# Patient Record
Sex: Female | Born: 1970 | Race: White | Hispanic: No | Marital: Married | Smoking: Former smoker
Health system: Southern US, Community
[De-identification: ages and names within clinical notes are randomized; demographics above are authoritative.]

## PROBLEM LIST (undated history)

## (undated) DIAGNOSIS — R002 Palpitations: Secondary | ICD-10-CM

## (undated) DIAGNOSIS — K219 Gastro-esophageal reflux disease without esophagitis: Secondary | ICD-10-CM

## (undated) DIAGNOSIS — Z9889 Other specified postprocedural states: Secondary | ICD-10-CM

## (undated) DIAGNOSIS — I499 Cardiac arrhythmia, unspecified: Secondary | ICD-10-CM

## (undated) DIAGNOSIS — G4730 Sleep apnea, unspecified: Secondary | ICD-10-CM

## (undated) DIAGNOSIS — R112 Nausea with vomiting, unspecified: Secondary | ICD-10-CM

## (undated) DIAGNOSIS — I10 Essential (primary) hypertension: Secondary | ICD-10-CM

## (undated) DIAGNOSIS — F329 Major depressive disorder, single episode, unspecified: Secondary | ICD-10-CM

## (undated) DIAGNOSIS — F419 Anxiety disorder, unspecified: Secondary | ICD-10-CM

## (undated) DIAGNOSIS — I493 Ventricular premature depolarization: Secondary | ICD-10-CM

## (undated) DIAGNOSIS — R42 Dizziness and giddiness: Secondary | ICD-10-CM

## (undated) DIAGNOSIS — F32A Depression, unspecified: Secondary | ICD-10-CM

## (undated) HISTORY — DX: Morbid (severe) obesity due to excess calories: E66.01

## (undated) HISTORY — DX: Ventricular premature depolarization: I49.3

## (undated) HISTORY — DX: Dizziness and giddiness: R42

## (undated) HISTORY — DX: Anxiety disorder, unspecified: F41.9

## (undated) HISTORY — DX: Essential (primary) hypertension: I10

## (undated) HISTORY — DX: Palpitations: R00.2

## (undated) HISTORY — PX: OTHER SURGICAL HISTORY: SHX169

## (undated) HISTORY — DX: Gastro-esophageal reflux disease without esophagitis: K21.9

---

## 2000-09-22 ENCOUNTER — Emergency Department (HOSPITAL_COMMUNITY): Admission: EM | Admit: 2000-09-22 | Discharge: 2000-09-22 | Payer: Self-pay | Admitting: Emergency Medicine

## 2001-06-10 ENCOUNTER — Encounter: Payer: Self-pay | Admitting: *Deleted

## 2001-06-10 ENCOUNTER — Emergency Department (HOSPITAL_COMMUNITY): Admission: EM | Admit: 2001-06-10 | Discharge: 2001-06-10 | Payer: Self-pay | Admitting: *Deleted

## 2002-01-24 ENCOUNTER — Emergency Department (HOSPITAL_COMMUNITY): Admission: EM | Admit: 2002-01-24 | Discharge: 2002-01-24 | Payer: Self-pay | Admitting: Emergency Medicine

## 2002-01-27 ENCOUNTER — Encounter: Payer: Self-pay | Admitting: Pediatrics

## 2002-01-27 ENCOUNTER — Ambulatory Visit (HOSPITAL_COMMUNITY): Admission: RE | Admit: 2002-01-27 | Discharge: 2002-01-27 | Payer: Self-pay | Admitting: Pediatrics

## 2002-09-15 ENCOUNTER — Emergency Department (HOSPITAL_COMMUNITY): Admission: EM | Admit: 2002-09-15 | Discharge: 2002-09-15 | Payer: Self-pay | Admitting: Emergency Medicine

## 2003-01-21 ENCOUNTER — Ambulatory Visit (HOSPITAL_COMMUNITY): Admission: RE | Admit: 2003-01-21 | Discharge: 2003-01-21 | Payer: Self-pay | Admitting: Family Medicine

## 2004-01-12 ENCOUNTER — Ambulatory Visit: Payer: Self-pay | Admitting: Cardiology

## 2004-01-28 ENCOUNTER — Ambulatory Visit: Payer: Self-pay | Admitting: Cardiology

## 2010-01-03 ENCOUNTER — Emergency Department (HOSPITAL_COMMUNITY)
Admission: EM | Admit: 2010-01-03 | Discharge: 2010-01-03 | Payer: Self-pay | Source: Home / Self Care | Admitting: Emergency Medicine

## 2010-02-19 ENCOUNTER — Encounter: Payer: Self-pay | Admitting: Pediatrics

## 2010-04-12 LAB — BASIC METABOLIC PANEL
BUN: 8 mg/dL (ref 6–23)
CO2: 29 mEq/L (ref 19–32)
Calcium: 8.8 mg/dL (ref 8.4–10.5)
Chloride: 102 mEq/L (ref 96–112)
Creatinine, Ser: 0.78 mg/dL (ref 0.4–1.2)
GFR calc Af Amer: >60 mL/min (ref >60)
GFR calc non Af Amer: >60 mL/min (ref >60)
Glucose, Bld: 107 mg/dL — ABNORMAL HIGH (ref 70–99)
Potassium: 4.7 mEq/L (ref 3.5–5.1)
Sodium: 137 mEq/L (ref 135–145)

## 2010-04-12 LAB — CBC
HCT: 37.8 % (ref 36.0–46.0)
Hemoglobin: 13.1 g/dL (ref 12.0–15.0)
MCH: 30.6 pg (ref 26.0–34.0)
MCHC: 34.7 g/dL (ref 30.0–36.0)
MCV: 88.3 fL (ref 78.0–100.0)
Platelets: 270 10*3/uL (ref 150–400)
RBC: 4.28 MIL/uL (ref 3.87–5.11)
RDW: 13.1 % (ref 11.5–15.5)
WBC: 10.9 10*3/uL — ABNORMAL HIGH (ref 4.0–10.5)

## 2010-04-12 LAB — DIFFERENTIAL
Basophils Absolute: 0 10*3/uL (ref 0.0–0.1)
Basophils Relative: 0 % (ref 0–1)
Eosinophils Absolute: 0.2 10*3/uL (ref 0.0–0.7)
Eosinophils Relative: 2 % (ref 0–5)
Lymphocytes Relative: 21 % (ref 12–46)
Lymphs Abs: 2.2 10*3/uL (ref 0.7–4.0)
Monocytes Absolute: 0.7 10*3/uL (ref 0.1–1.0)
Monocytes Relative: 6 % (ref 3–12)
Neutro Abs: 7.7 10*3/uL (ref 1.7–7.7)
Neutrophils Relative %: 71 % (ref 43–77)

## 2012-02-10 ENCOUNTER — Encounter: Payer: Self-pay | Admitting: Physician Assistant

## 2012-02-19 ENCOUNTER — Encounter: Payer: Self-pay | Admitting: Cardiology

## 2012-02-21 ENCOUNTER — Encounter: Payer: Self-pay | Admitting: Cardiology

## 2012-02-21 ENCOUNTER — Ambulatory Visit (INDEPENDENT_AMBULATORY_CARE_PROVIDER_SITE_OTHER): Payer: Commercial Indemnity | Admitting: Cardiology

## 2012-02-21 VITALS — BP 136/87 | HR 82 | Ht 62.0 in | Wt 271.0 lb

## 2012-02-21 DIAGNOSIS — Z79899 Other long term (current) drug therapy: Secondary | ICD-10-CM

## 2012-02-21 DIAGNOSIS — I2789 Other specified pulmonary heart diseases: Secondary | ICD-10-CM

## 2012-02-21 DIAGNOSIS — I493 Ventricular premature depolarization: Secondary | ICD-10-CM

## 2012-02-21 DIAGNOSIS — I272 Pulmonary hypertension, unspecified: Secondary | ICD-10-CM | POA: Insufficient documentation

## 2012-02-21 DIAGNOSIS — I4949 Other premature depolarization: Secondary | ICD-10-CM

## 2012-02-21 DIAGNOSIS — R002 Palpitations: Secondary | ICD-10-CM | POA: Insufficient documentation

## 2012-02-21 MED ORDER — METOPROLOL SUCCINATE ER 25 MG PO TB24: 25.0000 mg | ORAL_TABLET | Freq: Two times a day (BID) | ORAL | Status: DC

## 2012-02-21 NOTE — Progress Notes (Signed)
Patient ID: Rachel Holland, female   DOB: 19-Sep-1970, 42 y.o.   MRN: [id number] PCP: Dr. Sherryll Burger  42 yo with history of symptomatic PVCs and pulmonary HTN on prior echo presents for evaluation of palpitations.  Patient was admitted to [facility] in 11/12 with chest pain and palpitations.  Telemetry showed frequent PVCs.  CTA chest was negative for PE.  Stress echo was submaximal but showed no regional wall motion abnormalities.  Echo showed a mildly dilated RV with PA systolic pressure estimated at 50 mmHg.  She was diagnosed with OSA around this time and started on CPAP.  She was started on metoprolol 25 mg bid in the hospital and palpitations resolved.    She did well until about 3 wks ago.  Some time back she had decreased her metoprolol to 12.5 mg bid. About 3 wks ago, she began to notice increased palpitations again.  No lightheadedness.  They were quite bothersome.  She went to the ER, ECG showed PVCs.  Metoprolol was increased back to 25 mg bid.  However, she continues to have palpitations.  No definite trigger for symptoms.  No increased stress.  She has been using her CPAP.  She chronically has drunk a fair amount of caffeine but has been off caffeine now for 3 wks.  No new medications.  She denies chest pain or significant dyspnea.  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    ECG (1/14): NSR, frequent PVCs.  PMH: 1. HTN 2. H/o PVCs 3. Pulmonary HTN: ? Due to OSA.  Echo (11/12) with EF 60-65%, mild to moderate LVH, mild RV dilation with normal systolic function, PA systolic pressure 50 mmHg. 4. OSA on CPAP. 5. Stress echo (11/12) with 4'57", submaximal, no wall motion abnormalities.  6. Obesity 7. Anxiety  SH: Lives in [place], quit smoking in 2011, cleans houses, 3 children.   FH: Father with parkinsons, Guillain-Barre syndrome.   ROS: All systems reviewed and negative except as per HPI.   Current Outpatient Prescriptions  Medication Sig Dispense Refill  .  clonazePAM (KLONOPIN) 0.5 MG tablet Take 0.5 mg by mouth as needed.       . ranitidine (ZANTAC) 150 MG tablet Take 150 mg by mouth 2 (two) times daily.      . sertraline (ZOLOFT) 50 MG tablet Take 50 mg by mouth daily.        BP 136/87  Pulse 82  Ht 5\' 2"  (1.575 m)  Wt 271 lb (122.925 kg)  BMI 49.57 kg/m2 General: NAD, obese Neck: Thick, no JVD, no thyromegaly or thyroid nodule.  Lungs: Clear to auscultation bilaterally with normal respiratory effort. CV: Nondisplaced PMI.  Heart regular with premature beats, no S3/S4, no murmur.  No peripheral edema.  No carotid bruit.  Normal pedal pulses.  Abdomen: Soft, nontender, no hepatosplenomegaly, no distention.  Skin: Intact without lesions or rashes.  Neurologic: Alert and oriented x 3.  Psych: Normal affect. Extremities: No clubbing or cyanosis.  HEENT: Normal.   Assessment/Plan: 1. Palpitations: Most likely due to PVCs.  Not sure why they have all of the sudden worsened.  She does not seem to have had any significant events to explain the worsening.  She is now off caffeine and has increased her metoprolol back to 25 mg bid without much relief. - 48 hour holter to document frequency of PVCs and to make sure that there are no other arrhythmias.  - Stop short-acting metoprolol, will have her take Toprol XL  25 mg bid for better coverage.   - Check CBC for anemia and TSH.  2. Pulmonary HTN: PA systolic pressure 50 mmHg with mild RV dilation on echo from 11/12.  This may be due to OSA => she started CPAP around that time.  I will have her get a repeat echo to reassess PA pressure and RV size/function.   3. Obesity: She needs to work on weight loss with diet and exercise.  We discussed this.   Marca Ancona 02/21/2012 2:36 PM

## 2012-02-21 NOTE — Patient Instructions (Addendum)
   Echo   48 hour holter monitor  Stop Metoprolol Tartrate  Begin Toprol XL 25mg  twice a day    Labs today for TSH, BMET, CBC   Office will contact with results Follow up in  2 weeks

## 2012-02-23 ENCOUNTER — Telehealth: Payer: Self-pay | Admitting: *Deleted

## 2012-02-23 ENCOUNTER — Ambulatory Visit (INDEPENDENT_AMBULATORY_CARE_PROVIDER_SITE_OTHER): Payer: Commercial Indemnity | Admitting: *Deleted

## 2012-02-23 VITALS — BP 107/67 | HR 86

## 2012-02-23 DIAGNOSIS — R002 Palpitations: Secondary | ICD-10-CM

## 2012-02-23 DIAGNOSIS — I493 Ventricular premature depolarization: Secondary | ICD-10-CM

## 2012-02-23 DIAGNOSIS — I4949 Other premature depolarization: Secondary | ICD-10-CM

## 2012-02-23 NOTE — Telephone Encounter (Signed)
Patient in office this morning for her 48 hour holter monitor.  Patient notified and verbalized understanding.

## 2012-02-23 NOTE — Progress Notes (Signed)
Patient in office this morning for 48 hour holter monitor placement.  States she was not able to begin the Toprol XL 25mg  twice a day due to cost of her co-pay.  She is still taking the Metoprolol Tart 25mg  twice a day.  States that she does notice that she has felt a little light headed lately with recent increase on the Metoprolol.  She previously was taking 12.5mg  twice a day.  BP this morning in office was 107/67  86.  She is scheduled for her Echo on Wednesday.

## 2012-02-23 NOTE — Telephone Encounter (Signed)
Message copied by Lesle Chris on Fri Feb 23, 2012 11:03 AM ------      Message from: Laurey Morale      Created: Thu Feb 22, 2012  6:00 PM       Slight elevation in WBCs, otherwise labs all normal.

## 2012-02-28 ENCOUNTER — Other Ambulatory Visit (INDEPENDENT_AMBULATORY_CARE_PROVIDER_SITE_OTHER): Payer: Commercial Indemnity

## 2012-02-28 ENCOUNTER — Other Ambulatory Visit: Payer: Self-pay

## 2012-02-28 DIAGNOSIS — I2789 Other specified pulmonary heart diseases: Secondary | ICD-10-CM

## 2012-02-28 DIAGNOSIS — I4949 Other premature depolarization: Secondary | ICD-10-CM

## 2012-02-28 DIAGNOSIS — R002 Palpitations: Secondary | ICD-10-CM

## 2012-02-28 DIAGNOSIS — I272 Pulmonary hypertension, unspecified: Secondary | ICD-10-CM

## 2012-02-29 ENCOUNTER — Telehealth: Payer: Self-pay | Admitting: *Deleted

## 2012-02-29 NOTE — Telephone Encounter (Signed)
Message copied by Lesle Chris on Thu Feb 29, 2012 12:02 PM ------      Message from: Laurey Morale      Created: Wed Feb 28, 2012  8:14 PM       Still with mild pulmonary HTN at least.  Will need to have followup in office, consider right heart cath.

## 2012-02-29 NOTE — Telephone Encounter (Signed)
Notes Recorded by Lesle Chris, LPN on 1/61/0960 at 12:02 PM Patient notified. OV scheduled with Dr. Shirlee Latch for tomorrow at 9:15. ------  Notes Recorded by Lesle Chris, LPN on 4/54/0981 at 10:24 AM Left message to return call.

## 2012-03-01 ENCOUNTER — Ambulatory Visit (INDEPENDENT_AMBULATORY_CARE_PROVIDER_SITE_OTHER): Payer: Commercial Indemnity | Admitting: Cardiology

## 2012-03-01 ENCOUNTER — Encounter: Payer: Self-pay | Admitting: Cardiology

## 2012-03-01 VITALS — BP 127/84 | HR 84 | Ht 62.0 in | Wt 266.0 lb

## 2012-03-01 DIAGNOSIS — I2789 Other specified pulmonary heart diseases: Secondary | ICD-10-CM

## 2012-03-01 DIAGNOSIS — R0989 Other specified symptoms and signs involving the circulatory and respiratory systems: Secondary | ICD-10-CM

## 2012-03-01 DIAGNOSIS — I272 Pulmonary hypertension, unspecified: Secondary | ICD-10-CM

## 2012-03-01 DIAGNOSIS — R002 Palpitations: Secondary | ICD-10-CM

## 2012-03-01 MED ORDER — METOPROLOL TARTRATE 25 MG PO TABS: 25.0000 mg | ORAL_TABLET | Freq: Three times a day (TID) | ORAL | Status: DC

## 2012-03-01 NOTE — Patient Instructions (Addendum)
   Lab for ANA  Office will contact with results  Increase Metoprolol to 25mg  three times per day (new sent to pharm)  Echo just before next office visit Your physician wants you to follow up in: 6 months.  You will receive a reminder letter in the mail one-two months in advance.  If you don't receive a letter, please call our office to schedule the follow up appointment

## 2012-03-01 NOTE — Progress Notes (Signed)
Patient ID: Rachel Holland, female   DOB: November 18, 1970, 42 y.o.   MRN: [id number] PCP: Dr. Sherryll Burger  42 yo with history of symptomatic PVCs and pulmonary HTN on prior echo returns for cardiology evaluation of PVCs and pulmonary hypertension.  Patient was admitted to [facility] in 11/12 with chest pain and palpitations.  Telemetry showed frequent PVCs.  CTA chest was negative for PE.  Stress echo was submaximal but showed no regional wall motion abnormalities.  Echo showed a mildly dilated RV with PA systolic pressure estimated at 50 mmHg.  She was diagnosed with OSA around this time and started on CPAP.  She was started on metoprolol 25 mg bid in the hospital and palpitations resolved.  She did well until about 6-8 wks ago.  She had decreased her metoprolol to 12.5 mg bid, and she began to notice increased palpitations again.  No lightheadedness.  They were quite bothersome.  She went to the ER, ECG showed PVCs.  Metoprolol was increased back to 25 mg bid.  However, she continues to have palpitations.  No definite trigger for symptoms.  No increased stress.  She has been using her CPAP.  She chronically has drunk a fair amount of caffeine but has been off caffeine now for several weeks.  She denies chest pain or significant dyspnea.  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  She is mildly winded going up a flight of steps.   Now that she has been on CPAP, I repeated an echocardiogram to reassess pulmonary artery pressure.  Her RV was borderline enlarged but RV systolic function was normal. PA systolic pressure was estimated at 40-45 mmHg.  I had her do a holter monitor, which showed frequent PVCs, comprising about 6% of total beats. No VT/NSVT.   Labs (1/14): K 3.8, creatinine 0.75, TSH normal, HCT 41.4  PMH: 1. HTN 2. H/o PVCs: Holter (1/14) with frequent PVCs (6% of total beats), no VT/NSVT.  3. Pulmonary HTN: ? Due to OSA.  Echo (11/12) with EF 60-65%, mild to moderate LVH,  mild RV dilation with normal systolic function, PA systolic pressure 50 mmHg.  Echo (1/14): EF 60-65%, RV upper normal in size with normal systolic function, PA systolic pressure 40-45 mmHg.  4. OSA on CPAP. 5. Stress echo (11/12) with 4'57", submaximal, no wall motion abnormalities.  6. Obesity 7. Anxiety  SH: Lives in [place], quit smoking in 2011, cleans houses, 3 children.   FH: Father with parkinsons, Guillain-Barre syndrome.   ROS: All systems reviewed and negative except as per HPI.   Current Outpatient Prescriptions  Medication Sig Dispense Refill  . clonazePAM (KLONOPIN) 0.5 MG tablet Take 0.5 mg by mouth as needed.       . metoprolol tartrate (LOPRESSOR) 25 MG tablet Take 1 tablet (25 mg total) by mouth 3 (three) times daily.  90 tablet  6  . ranitidine (ZANTAC) 150 MG tablet Take 150 mg by mouth 2 (two) times daily.      . sertraline (ZOLOFT) 50 MG tablet Take 50 mg by mouth daily.        BP 127/84  Pulse 84  Ht 5\' 2"  (1.575 m)  Wt 266 lb (120.657 kg)  BMI 48.65 kg/m2 General: NAD, obese Neck: Thick, no JVD, no thyromegaly or thyroid nodule.  Lungs: Clear to auscultation bilaterally with normal respiratory effort. CV: Nondisplaced PMI.  Heart regular with premature beats, no S3/S4, no murmur.  No peripheral edema.  No carotid bruit.  Normal pedal pulses.  Abdomen: Soft, nontender, no hepatosplenomegaly, no distention.  Neurologic: Alert and oriented x 3.  Psych: Anxious Extremities: No clubbing or cyanosis.   Assessment/Plan: 1. Palpitations: Patient has frequent PVCs.  Not sure why they have all of the sudden worsened.  She does not seem to have had any significant events to explain the worsening.  She is now off caffeine.  The PVCs comprise 6% of total beats on holter, this is probably not enough to lead to a cardiomyopathy.  LV systolic function was normal on echo.  I had wanted to change her to Toprol XL for better coverage, but it is too expensive for her.  TSH was  normal.  - Increase metoprolol to 25 mg tid.  Increasing the dose of metoprolol from 25 mg makes her lightheaded, so I will increase the frequency for better coverage.  She says that metoprolol helps when she takes it.  - I encouraged her to avoid caffeine and to get regular exercise and sleep.   2. Pulmonary HTN: PA systolic pressure estimated 40-45 mmHg with borderline RV dilation on most recent echo.  This seems improved from the 11/12 echo.  She has been on CPAP over this time.  I think that her apparently mild pulmonary HTN may be due primarily to OSA.  I suggested that she work very hard on weight loss over the next 6 months, with goal being 20-30 lbs.  Will repeat an echo after this to see if PA pressure further improves.  At this time, unless PA pressure worsens or she develops more dyspnea, I would not do a right heart cath.  I will get an ANA to screen for collagen vascular disease.  3. Obesity: She is morbidly obese with BMI 48.  She needs to work on weight loss with diet and exercise.  We discussed this again.   Marca Ancona 03/01/2012 11:19 AM

## 2012-03-25 ENCOUNTER — Encounter: Payer: Self-pay | Admitting: *Deleted

## 2012-03-26 ENCOUNTER — Telehealth: Payer: Self-pay | Admitting: Cardiology

## 2012-03-26 NOTE — Telephone Encounter (Signed)
Patient called wanting test results from lab work. Informed patient that Dr. Shirlee Latch has not received the labs yet but I would print them off and send them to him and as soon as he told his nurse the results his nurse would call her back. Pt verbalized understanding.

## 2012-04-12 ENCOUNTER — Ambulatory Visit: Payer: Self-pay | Admitting: Cardiology

## 2012-05-24 ENCOUNTER — Other Ambulatory Visit: Payer: Self-pay | Admitting: *Deleted

## 2012-05-24 DIAGNOSIS — R002 Palpitations: Secondary | ICD-10-CM

## 2012-05-24 DIAGNOSIS — R0989 Other specified symptoms and signs involving the circulatory and respiratory systems: Secondary | ICD-10-CM

## 2012-07-24 ENCOUNTER — Other Ambulatory Visit: Payer: Self-pay

## 2012-07-24 ENCOUNTER — Other Ambulatory Visit (INDEPENDENT_AMBULATORY_CARE_PROVIDER_SITE_OTHER): Payer: Commercial Indemnity

## 2012-07-24 DIAGNOSIS — R002 Palpitations: Secondary | ICD-10-CM

## 2012-07-24 DIAGNOSIS — I2789 Other specified pulmonary heart diseases: Secondary | ICD-10-CM

## 2012-07-24 DIAGNOSIS — R0989 Other specified symptoms and signs involving the circulatory and respiratory systems: Secondary | ICD-10-CM

## 2012-07-26 ENCOUNTER — Telehealth: Payer: Self-pay | Admitting: *Deleted

## 2012-07-26 NOTE — Telephone Encounter (Signed)
Patient informed. 

## 2012-07-26 NOTE — Telephone Encounter (Signed)
Message copied by Eustace Moore on Fri Jul 26, 2012  4:40 PM ------      Message from: Laurey Morale      Created: Fri Jul 26, 2012  3:30 PM       Normal EF ------

## 2012-07-30 ENCOUNTER — Encounter: Payer: Self-pay | Admitting: Cardiology

## 2012-07-30 ENCOUNTER — Ambulatory Visit (INDEPENDENT_AMBULATORY_CARE_PROVIDER_SITE_OTHER): Payer: Commercial Indemnity | Admitting: Cardiology

## 2012-07-30 VITALS — BP 129/81 | HR 86 | Ht 62.0 in | Wt 266.0 lb

## 2012-07-30 DIAGNOSIS — I2789 Other specified pulmonary heart diseases: Secondary | ICD-10-CM

## 2012-07-30 DIAGNOSIS — I272 Pulmonary hypertension, unspecified: Secondary | ICD-10-CM

## 2012-07-30 DIAGNOSIS — R002 Palpitations: Secondary | ICD-10-CM

## 2012-07-30 NOTE — Patient Instructions (Addendum)
Continue all current medications. Bariatric phone number given - [phone] Your physician wants you to follow up in: 6 months.  You will receive a reminder letter in the mail one-two months in advance.  If you don't receive a letter, please call our office to schedule the follow up appointment

## 2012-07-30 NOTE — Progress Notes (Signed)
Patient ID: Rachel Holland, female   DOB: 1970/09/26, 42 y.o.   MRN: [id number] PCP: Dr. Sherryll Burger  42 yo with history of symptomatic PVCs and pulmonary HTN on prior echo returns for cardiology evaluation of PVCs and pulmonary hypertension.  Patient was admitted to [facility] in 11/12 with chest pain and palpitations.  Telemetry showed frequent PVCs.  CTA chest was negative for PE.  Stress echo was submaximal but showed no regional wall motion abnormalities.  Echo showed a mildly dilated RV with PA systolic pressure estimated at 50 mmHg.  She was diagnosed with OSA around this time and started on CPAP, which she is now using regularly.  She is now taking metoprolol 25 mg tid for her palpitations.  She still feels them but they seem less than in the past. She denies chest pain.  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  She is winded going up a flight of steps.  Most recent echo in 6/14 showed normal RV systolic function with upper limits of normal RV size.  Peak RV-RA gradient was 30 mmHg, which is lower than in the past.  Unfortunately, she has not lost any weight.    Labs (1/14): K 3.8, creatinine 0.75, TSH normal, HCT 41.4, ANA negative  PMH: 1. HTN 2. H/o PVCs: Holter (1/14) with frequent PVCs (6% of total beats), no VT/NSVT.  3. Pulmonary HTN: ? Due to OSA.  Echo (11/12) with EF 60-65%, mild to moderate LVH, mild RV dilation with normal systolic function, PA systolic pressure 50 mmHg.  Echo (1/14): EF 60-65%, RV upper normal in size with normal systolic function, PA systolic pressure 40-45 mmHg.  Echo (6/14) with EF 60-65%, RV upper limits normal in size with normal systolic function, peak RV-RA gradient 30 mmHg.  4. OSA on CPAP. 5. Stress echo (11/12) with 4'57", submaximal, no wall motion abnormalities.  6. Obesity 7. Anxiety  SH: Lives in [place], quit smoking in 2011, cleans houses, 3 children.   FH: Father with parkinsons, Guillain-Barre syndrome.   Current  Outpatient Prescriptions  Medication Sig Dispense Refill  . clonazePAM (KLONOPIN) 0.5 MG tablet Take 0.5 mg by mouth as needed.       . metoprolol tartrate (LOPRESSOR) 25 MG tablet Take 1 tablet (25 mg total) by mouth 3 (three) times daily.  90 tablet  6  . ranitidine (ZANTAC) 150 MG tablet Take 150 mg by mouth 2 (two) times daily.      . sertraline (ZOLOFT) 50 MG tablet Take 50 mg by mouth daily.       No current facility-administered medications for this visit.    BP 129/81  Pulse 86  Ht 5\' 2"  (1.575 m)  Wt 266 lb (120.657 kg)  BMI 48.64 kg/m2 General: NAD, obese Neck: Thick, no JVD, no thyromegaly or thyroid nodule.  Lungs: Clear to auscultation bilaterally with normal respiratory effort. CV: Nondisplaced PMI.  Heart regular with premature beats, no S3/S4, no murmur.  No peripheral edema.  No carotid bruit.  Normal pedal pulses.  Abdomen: Soft, nontender, no hepatosplenomegaly, no distention.  Neurologic: Alert and oriented x 3.  Psych: Anxious Extremities: No clubbing or cyanosis.   Assessment/Plan: 1. Palpitations: Patient has had frequent PVCs on prior monitoring.  Symptoms are stable and manageable on metoprolol 25 mg tid.  She has not been able to afford Toprol XL.  2. Pulmonary HTN: Peak RV-RA gradient 30 mmHg on most recent echo, which is improved compared to the prior.  She  has been very compliant with her CPAP.  I think that her apparently mild pulmonary HTN may be due primarily to OSA.  I again urged her to work on weight loss.  Unless PA pressure worsens on echo or she develops more dyspnea, I would not do a right heart cath.  3. Obesity: She is morbidly obese with BMI 48.  She needs to work on weight loss with diet and exercise.  I gave her the number for the bariatric program at [facility].   Marca Ancona 07/30/2012

## 2013-02-21 ENCOUNTER — Encounter: Payer: Self-pay | Admitting: Cardiovascular Disease

## 2013-02-21 ENCOUNTER — Ambulatory Visit (INDEPENDENT_AMBULATORY_CARE_PROVIDER_SITE_OTHER): Payer: Commercial Indemnity | Admitting: Cardiovascular Disease

## 2013-02-21 VITALS — BP 115/80 | HR 76 | Ht 62.0 in | Wt 280.0 lb

## 2013-02-21 DIAGNOSIS — R002 Palpitations: Secondary | ICD-10-CM

## 2013-02-21 DIAGNOSIS — I272 Pulmonary hypertension, unspecified: Secondary | ICD-10-CM

## 2013-02-21 DIAGNOSIS — Z7182 Exercise counseling: Secondary | ICD-10-CM

## 2013-02-21 DIAGNOSIS — Z79899 Other long term (current) drug therapy: Secondary | ICD-10-CM

## 2013-02-21 DIAGNOSIS — I2789 Other specified pulmonary heart diseases: Secondary | ICD-10-CM

## 2013-02-21 NOTE — Progress Notes (Signed)
Patient ID: Rachel Holland, female   DOB: May 30, 1970, 43 y.o.   MRN: [id number]      SUBJECTIVE: The patient presents for routine cardiovascular followup for symptomatic PVCs and pulmonary hypertension. This is my first time meeting her. She is a 43 yr old woman who was admitted to [facility]  in 12/2010 with chest pain and palpitations. Telemetry showed frequent PVCs. CTA chest was negative for PE. Stress echo was submaximal but showed no regional wall motion abnormalities. Echo showed a mildly dilated RV with PA systolic pressure estimated at 50 mmHg. She was diagnosed with OSA around this time and started on CPAP, which she uses regularly. She takes metoprolol 25 mg tid for her palpitations and these are well controlled. She denies chest pain.  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 She is winded going up a flight of steps. Most recent echo in 06/2012 showed normal RV systolic function with upper limits of normal RV size. Peak RV-RA gradient was 30 mmHg, which is lower than in the past. Unfortunately, she has not lost any weight. In fact, at her last visit with Dr. Aundra Dubin, she weighed 266 lbs and now weighs 280 lbs. She says she has made no effort whatsoever to lose weight, and attributes this to laziness. She has recently begun a nutrition program through her church. She would like to one day go to [employer] and see [employer] play, but wants to be fit enough to be able to jump around and cheer for them. She has 3 children, the youngest of whom is 48 yrs old. Her husband is 6'5" and weighs over 300 lbs.  Labs (1/14): K 3.8, creatinine 0.75, TSH normal, HCT 41.4, ANA negative   PMH:  1. HTN 2. H/o PVCs: Holter (1/14) with frequent PVCs (6% of total beats), no VT/NSVT.  3. Pulmonary HTN: ? Due to OSA. Echo (11/12) with EF 60-65%, mild to moderate LVH, mild RV dilation with normal systolic function, PA systolic pressure 50 mmHg. Echo (1/14): EF 60-65%,  RV upper normal in size with normal systolic function, PA systolic pressure 72-09 mmHg. Echo (6/14) with EF 60-65%, RV upper limits normal in size with normal systolic function, peak RV-RA gradient 30 mmHg.  4. OSA on CPAP.  5. Stress echo (11/12) with 4'57", submaximal, no wall motion abnormalities.  6. Obesity  7. Anxiety   SH: Lives in [place], quit smoking in 2011, cleans houses, 3 children.  FH: Father with parkinsons, Guillain-Barre syndrome.      Allergies  Allergen Reactions  . Sulfa Antibiotics Nausea And Vomiting    Current Outpatient Prescriptions  Medication Sig Dispense Refill  . clonazePAM (KLONOPIN) 0.5 MG tablet Take 0.5 mg by mouth as needed.       . metoprolol tartrate (LOPRESSOR) 25 MG tablet Take 1 tablet (25 mg total) by mouth 3 (three) times daily.  90 tablet  6  . ranitidine (ZANTAC) 150 MG tablet Take 150 mg by mouth 2 (two) times daily.      . sertraline (ZOLOFT) 50 MG tablet Take 50 mg by mouth daily.       No current facility-administered medications for this visit.    Past Medical History  Diagnosis Date  . Hypertension   . Anxiety   . PVCs (premature ventricular contractions)   . Dizziness   . Morbid obesity     Past Surgical History  Procedure Laterality Date  . Arm surgery  History   Social History  . Marital Status: Married    Spouse Name: N/A    Number of Children: N/A  . Years of Education: N/A   Occupational History  . Not on file.   Social History Main Topics  . Smoking status: Former Smoker -- 1.00 packs/day for 20 years    Types: Cigarettes    Start date: 01/31/1984    Quit date: 01/31/2008  . Smokeless tobacco: Never Used  . Alcohol Use: Not on file  . Drug Use: Not on file  . Sexual Activity: Not on file   Other Topics Concern  . Not on file   Social History Narrative  . No narrative on file     Filed Vitals:   02/21/13 1038  BP: 115/80  Pulse: 76  Height: 5\' 2"  (1.575 m)  Weight: 280 lb (127.007 kg)      PHYSICAL EXAM General: NAD, morbidly obese Neck: No JVD, no thyromegaly or thyroid nodule.  Lungs: Clear to auscultation bilaterally with normal respiratory effort. CV: Nondisplaced PMI.  Heart regular S1/S2, no S3/S4, no murmur.  No peripheral edema.  No carotid bruit.  Normal pedal pulses.  Abdomen: Soft, nontender, no hepatosplenomegaly, no distention.  Neurologic: Alert and oriented x 3.  Psych: Normal affect. Extremities: No clubbing or cyanosis.   ECG: reviewed and available in electronic records.      ASSESSMENT AND PLAN: 1. Palpitations: Patient has had frequent PVCs on prior monitoring. Symptoms are stable and manageable on metoprolol 25 mg tid. She has not been able to afford Toprol XL in the past. 2. Pulmonary HTN: Peak RV-RA gradient 30 mmHg on most recent echo, which is improved compared to the prior. She has been very compliant with her CPAP. I think that her apparently mild pulmonary HTN may be due primarily to OSA. I urged her to work on weight loss. Unless PA pressure worsens on echo or she develops more dyspnea, I would not do a right heart cath.  3. Obesity: She is morbidly obese and needs to work on weight loss with diet and exercise. I gave her extensive counseling regarding this.  Dispo: f/u 6 months.   Kate Sable, M.D., F.A.C.C.

## 2013-02-21 NOTE — Patient Instructions (Signed)
Continue all current medications. Your physician wants you to follow up in: 6 months.  You will receive a reminder letter in the mail one-two months in advance.  If you don't receive a letter, please call our office to schedule the follow up appointment   

## 2013-03-10 ENCOUNTER — Other Ambulatory Visit: Payer: Self-pay | Admitting: Cardiology

## 2013-09-15 ENCOUNTER — Ambulatory Visit (INDEPENDENT_AMBULATORY_CARE_PROVIDER_SITE_OTHER): Payer: Commercial Indemnity | Admitting: Cardiovascular Disease

## 2013-09-15 ENCOUNTER — Encounter: Payer: Self-pay | Admitting: Cardiovascular Disease

## 2013-09-15 VITALS — BP 113/77 | HR 83 | Ht 62.0 in | Wt 289.0 lb

## 2013-09-15 DIAGNOSIS — Z713 Dietary counseling and surveillance: Secondary | ICD-10-CM

## 2013-09-15 DIAGNOSIS — I272 Pulmonary hypertension, unspecified: Secondary | ICD-10-CM

## 2013-09-15 DIAGNOSIS — G4733 Obstructive sleep apnea (adult) (pediatric): Secondary | ICD-10-CM

## 2013-09-15 DIAGNOSIS — I2789 Other specified pulmonary heart diseases: Secondary | ICD-10-CM

## 2013-09-15 DIAGNOSIS — Z7182 Exercise counseling: Secondary | ICD-10-CM

## 2013-09-15 NOTE — Patient Instructions (Signed)
Continue all current medications. Your physician wants you to follow up in:  1 year.  You will receive a reminder letter in the mail one-two months in advance.  If you don't receive a letter, please call our office to schedule the follow up appointment   

## 2013-09-15 NOTE — Progress Notes (Signed)
Patient ID: Rachel Holland, female   DOB: 1970-02-13, 43 y.o.   MRN: [id number]      SUBJECTIVE: The patient is here to followup for symptomatic PVCs with palpitations, pulmonary hypertension due to obstructive sleep apnea and morbid obesity. She is now up to 289 lbs from 280 lbs in 01/2013. She continues to drink soda but has modified her diet and is eating more baked foods. She said she still gets no physical activity whatsoever and has been unmotivated. She gets mildly short of breath climbing a flight of stairs but can walk on flat ground without any significant exertional dyspnea. She sees Dr. Dorris Fetch (endocrinology) in [place], and was started on a weight loss pill but had to stop this due to extreme nausea. ECG performed in the office today demonstrates normal sinus rhythm with a nonspecific T wave abnormality.   Review of Systems: As per "subjective", otherwise negative.  Allergies  Allergen Reactions  . Sulfa Antibiotics Nausea And Vomiting    Current Outpatient Prescriptions  Medication Sig Dispense Refill  . clonazePAM (KLONOPIN) 0.5 MG tablet Take 0.5 mg by mouth as needed.       . metoprolol tartrate (LOPRESSOR) 25 MG tablet TAKE ONE TABLET BY MOUTH THREE TIMES DAILY  90 tablet  6  . ranitidine (ZANTAC) 150 MG tablet Take 150 mg by mouth 2 (two) times daily.      . sertraline (ZOLOFT) 50 MG tablet Take 50 mg by mouth daily.       No current facility-administered medications for this visit.    Past Medical History  Diagnosis Date  . Hypertension   . Anxiety   . PVCs (premature ventricular contractions)   . Dizziness   . Morbid obesity     Past Surgical History  Procedure Laterality Date  . Arm surgery      History   Social History  . Marital Status: Married    Spouse Name: N/A    Number of Children: N/A  . Years of Education: N/A   Occupational History  . Not on file.   Social History Main Topics  . Smoking status: Former Smoker -- 1.00 packs/day  for 20 years    Types: Cigarettes    Start date: 01/31/1984    Quit date: 01/31/2008  . Smokeless tobacco: Never Used  . Alcohol Use: Not on file  . Drug Use: Not on file  . Sexual Activity: Not on file   Other Topics Concern  . Not on file   Social History Narrative  . No narrative on file     Filed Vitals:   09/15/13 1114  Height: 5\' 2"  (1.575 m)  Weight: 289 lb (131.09 kg)   BP 113/77  Pulse 83   PHYSICAL EXAM General: NAD, morbidly obese. Neck: No JVD, no thyromegaly. Lungs: Clear to auscultation bilaterally with normal respiratory effort. CV: Nondisplaced PMI.  Regular rate and rhythm, normal S1/S2, no S3/S4, no murmur. No pretibial or periankle edema.  No carotid bruit.  Normal pedal pulses.  Abdomen: Soft, obese. Neurologic: Alert and oriented x 3.  Psych: Normal affect. Extremities: No clubbing or cyanosis.   ECG: reviewed and available in electronic records.      ASSESSMENT AND PLAN: 1. Palpitations: Symptomatically stable. Continue treatment with metoprolol.  2. Pulmonary HTN: Peak RV-RA gradient 30 mmHg by echo in 06/2012, which is improved compared to the prior (36 mmHg previously). She has compliant with CPAP. I think that her apparently mild pulmonary HTN may be due  primarily to OSA and probable obesity-hypoventilation syndrome. I again urged her to work on weight loss. Unless PA pressure worsens on echo or she develops more significant dyspnea, I would not do a right heart cath. No indication for repeat echo at this time.  3. Obesity: She is morbidly obese and needs to work on weight loss with exercise, and needs to reduce soda consumption. I again gave her extensive counseling regarding this.   Dispo: f/u 1 year.  Kate Sable, M.D., F.A.C.C.

## 2014-06-24 ENCOUNTER — Other Ambulatory Visit (HOSPITAL_COMMUNITY): Payer: Self-pay | Admitting: "Endocrinology

## 2014-06-24 DIAGNOSIS — E279 Disorder of adrenal gland, unspecified: Principal | ICD-10-CM

## 2014-06-24 DIAGNOSIS — E278 Other specified disorders of adrenal gland: Secondary | ICD-10-CM

## 2014-07-08 ENCOUNTER — Ambulatory Visit (HOSPITAL_COMMUNITY)
Admission: RE | Admit: 2014-07-08 | Discharge: 2014-07-08 | Disposition: A | Discharge status: Home / Self Care | Payer: Managed Care, Other (non HMO) | Source: Ambulatory Visit | Attending: "Endocrinology | Admitting: "Endocrinology

## 2014-07-08 DIAGNOSIS — E279 Disorder of adrenal gland, unspecified: Secondary | ICD-10-CM | POA: Diagnosis present

## 2014-07-08 DIAGNOSIS — E278 Other specified disorders of adrenal gland: Secondary | ICD-10-CM

## 2014-08-05 ENCOUNTER — Ambulatory Visit: Payer: Commercial Indemnity | Admitting: Physician Assistant

## 2014-08-11 ENCOUNTER — Ambulatory Visit (INDEPENDENT_AMBULATORY_CARE_PROVIDER_SITE_OTHER): Payer: Managed Care, Other (non HMO) | Admitting: Cardiovascular Disease

## 2014-08-11 VITALS — BP 122/70 | HR 104 | Ht 62.0 in | Wt 294.6 lb

## 2014-08-11 DIAGNOSIS — G4733 Obstructive sleep apnea (adult) (pediatric): Secondary | ICD-10-CM | POA: Diagnosis not present

## 2014-08-11 DIAGNOSIS — Z136 Encounter for screening for cardiovascular disorders: Secondary | ICD-10-CM

## 2014-08-11 DIAGNOSIS — I270 Primary pulmonary hypertension: Secondary | ICD-10-CM | POA: Diagnosis not present

## 2014-08-11 DIAGNOSIS — R002 Palpitations: Secondary | ICD-10-CM | POA: Diagnosis not present

## 2014-08-11 DIAGNOSIS — I272 Pulmonary hypertension, unspecified: Secondary | ICD-10-CM

## 2014-08-11 DIAGNOSIS — R0602 Shortness of breath: Secondary | ICD-10-CM

## 2014-08-11 MED ORDER — DILTIAZEM HCL 60 MG PO TABS: 60.0000 mg | ORAL_TABLET | Freq: Two times a day (BID) | ORAL | Status: DC

## 2014-08-11 MED ORDER — DILTIAZEM HCL 60 MG PO TABS: 60.0000 mg | ORAL_TABLET | Freq: Four times a day (QID) | ORAL | Status: DC

## 2014-08-11 NOTE — Patient Instructions (Addendum)
Your physician recommends that you schedule a follow-up appointment in:  6 weeks with Dr Bronson Ing    STOP Metoprolol   START Diltiazem 60 mg twice a day     Thank you for choosing [facility] !

## 2014-08-11 NOTE — Addendum Note (Signed)
Addended by: Barbarann Ehlers A on: 08/11/2014 01:53 PM   Modules accepted: Medications

## 2014-08-11 NOTE — Progress Notes (Signed)
Patient ID: Rachel Holland, female   DOB: 09-22-1970, 44 y.o.   MRN: [id number]      SUBJECTIVE: The patient is here to followup for symptomatic PVCs with palpitations, pulmonary hypertension due to obstructive sleep apnea, and morbid obesity. She is now up to 294 lbs from 289 lbs in 08/2013. For the past 2 months she has experienced palpitations on most days of the week. They are exacerbated after eating. She has associated shortness of breath. She now drinks caffeine free soda. She initially lost 15 pounds but then put the weight back on. She does feel fatigued with metoprolol. She has an adrenal mass which is monitored by endocrinology.  ECG performed in the office today demonstrates normal sinus rhythm with a PVC and nonspecific T wave abnormality, HR 83 bpm.   Review of Systems: As per "subjective", otherwise negative.  Allergies  Allergen Reactions  . Sulfa Antibiotics Nausea And Vomiting    Current Outpatient Prescriptions  Medication Sig Dispense Refill  . clonazePAM (KLONOPIN) 0.5 MG tablet Take 0.5 mg by mouth as needed.     [name] omeprazole (PRILOSEC) 40 MG capsule Take 40 mg by mouth daily.    . sertraline (ZOLOFT) 50 MG tablet Take 50 mg by mouth daily.     No current facility-administered medications for this visit.    Past Medical History  Diagnosis Date  . Hypertension   . Anxiety   . PVCs (premature ventricular contractions)   . Dizziness   . Morbid obesity     Past Surgical History  Procedure Laterality Date  . Arm surgery      History   Social History  . Marital Status: Married    Spouse Name: N/A  . Number of Children: N/A  . Years of Education: N/A   Occupational History  . Not on file.   Social History Main Topics  . Smoking status: Former Smoker -- 1.00 packs/day for 20 years    Types: Cigarettes    Start date: 01/31/1984    Quit date: 01/31/2008  . Smokeless tobacco: Never Used  . Alcohol Use: Not on file  . Drug Use: Not on file  .  Sexual Activity: Not on file   Other Topics Concern  . Not on file   Social History Narrative  . No narrative on file     Filed Vitals:   08/11/14 1315  BP: 122/70  Pulse: 104  Height: 5\' 2"  (1.575 m)  Weight: 294 lb 9.6 oz (133.63 kg)  SpO2: 96%    PHYSICAL EXAM General: NAD, morbidly obese. Neck: No JVD, no thyromegaly. Lungs: Clear to auscultation bilaterally with normal respiratory effort. CV: Nondisplaced PMI. Regular rate and rhythm, normal S1/S2, no S3/S4, soft 2/6 pansystolic murmur along left sternal border. No signficant pretibial or periankle edema. No carotid bruit. Abdomen: Soft, obese. Neurologic: Alert and oriented x 3.  Psych: Normal affect. Extremities: No clubbing or cyanosis.   ECG: Most recent ECG reviewed.      ASSESSMENT AND PLAN: 1. Palpitations and shortness of breath: Given increase in symptoms but fatigue with metoprolol, will switch to short-acting diltiazem 60 mg bid. This can be titrated upwards at her next visit if she does not experience symptom relief.   2. Pulmonary HTN: More short of breath, associated with palpitations. Peak RV-RA gradient 30 mmHg by echo in 06/2012, which was improved compared to the prior (36 mmHg previously). She is compliant with CPAP. I think that her apparently mild pulmonary HTN may be  due primarily to OSA and probable obesity-hypoventilation syndrome. I may consider a repeat echocardiogram at her next visit if dyspnea progresses.  3. Morbid obesity: She is morbidly obese and needs to work on weight loss with exercise.   Dispo: f/u 6 weeks.   Kate Sable, M.D., F.A.C.C.

## 2014-09-29 ENCOUNTER — Ambulatory Visit: Payer: Commercial Indemnity | Admitting: Cardiovascular Disease

## 2015-10-15 ENCOUNTER — Encounter: Payer: Self-pay | Admitting: *Deleted

## 2015-10-15 ENCOUNTER — Ambulatory Visit (INDEPENDENT_AMBULATORY_CARE_PROVIDER_SITE_OTHER): Payer: PRIVATE HEALTH INSURANCE | Admitting: Cardiovascular Disease

## 2015-10-15 ENCOUNTER — Encounter: Payer: Self-pay | Admitting: Cardiovascular Disease

## 2015-10-15 VITALS — BP 134/83 | HR 84 | Ht 62.0 in | Wt 295.6 lb

## 2015-10-15 DIAGNOSIS — Z136 Encounter for screening for cardiovascular disorders: Secondary | ICD-10-CM | POA: Diagnosis not present

## 2015-10-15 DIAGNOSIS — R5383 Other fatigue: Secondary | ICD-10-CM | POA: Diagnosis not present

## 2015-10-15 DIAGNOSIS — R0602 Shortness of breath: Secondary | ICD-10-CM

## 2015-10-15 DIAGNOSIS — I272 Other secondary pulmonary hypertension: Secondary | ICD-10-CM | POA: Diagnosis not present

## 2015-10-15 DIAGNOSIS — R002 Palpitations: Secondary | ICD-10-CM

## 2015-10-15 DIAGNOSIS — G4733 Obstructive sleep apnea (adult) (pediatric): Secondary | ICD-10-CM

## 2015-10-15 MED ORDER — DILTIAZEM HCL 60 MG PO TABS: 60.0000 mg | ORAL_TABLET | Freq: Two times a day (BID) | ORAL | 6 refills | Status: DC

## 2015-10-15 NOTE — Patient Instructions (Addendum)
Medication Instructions:   Stop Metoprolol.  Begin Diltiazem 60mg  twice a day.  Continue all other medications.    Labwork: none  Testing/Procedures:  Your physician has requested that you have a lexiscan myoview. For further information please visit [email or web address]. Please follow instruction sheet, as given. (2 day)   Office will contact with results via phone or letter.    Follow-Up: 3 months  Any Other Special Instructions Will Be Listed Below (If Applicable).  If you need a refill on your cardiac medications before your next appointment, please call your pharmacy.

## 2015-10-15 NOTE — Progress Notes (Signed)
SUBJECTIVE: The patient is here to followup for symptomatic PVCs with palpitations, pulmonary hypertension due to obstructive sleep apnea, and morbid obesity.  She is still 295 pounds. She previously met with an endocrinologist who recommended weight loss surgery.  She feels markedly fatigued. Her palpitations are worse. I started diltiazem at her last visit and she tried it for 3 months but her insurance company stopped paying for it and she was put back on metoprolol by her PCP.  ECG performed in the office today which I personally interpreted demonstrated sinus rhythm with a diffuse nonspecific T wave abnormality.  Denies chest pain.   Review of Systems: As per "subjective", otherwise negative.  Allergies  Allergen Reactions  . Sulfa Antibiotics Nausea And Vomiting    Current Outpatient Prescriptions  Medication Sig Dispense Refill  . clonazePAM (KLONOPIN) 0.5 MG tablet Take 0.5 mg by mouth as needed for anxiety.     . metoprolol tartrate (LOPRESSOR) 25 MG tablet Take 25 mg by mouth 2 (two) times daily.    [name] omeprazole (PRILOSEC) 40 MG capsule Take 40 mg by mouth daily.    . sertraline (ZOLOFT) 50 MG tablet Take 50 mg by mouth daily.     No current facility-administered medications for this visit.     Past Medical History:  Diagnosis Date  . Anxiety   . Dizziness   . Hypertension   . Morbid obesity ([place])   . PVCs (premature ventricular contractions)     Past Surgical History:  Procedure Laterality Date  . arm surgery      Social History   Social History  . Marital status: Married    Spouse name: N/A  . Number of children: N/A  . Years of education: N/A   Occupational History  . Not on file.   Social History Main Topics  . Smoking status: Former Smoker    Packs/day: 1.00    Years: 20.00    Types: Cigarettes    Start date: 01/31/1984    Quit date: 01/31/2008  . Smokeless tobacco: Never Used  . Alcohol use Not on file  . Drug use: Unknown  . Sexual  activity: Not on file   Other Topics Concern  . Not on file   Social History Narrative  . No narrative on file     Vitals:   10/15/15 1357  BP: 134/83  Pulse: 84  Weight: 295 lb 9.6 oz (134.1 kg)  Height: 5' 2"  (1.575 m)    PHYSICAL EXAM General: NAD, morbidly obese. Neck: No JVD, no thyromegaly. Lungs: Clear to auscultation bilaterally with normal respiratory effort. CV: Nondisplaced PMI. Regular rate and rhythm, normal S1/S2, no S3/S4, soft 2/6 pansystolic murmur along left sternal border. No signficant pretibial or periankle edema.  Abdomen: Soft, obese. Neurologic: Alert and oriented x 3.  Psych: Normal affect. Extremities: No clubbing or cyanosis.     ECG: Most recent ECG reviewed.      ASSESSMENT AND PLAN: 1. Palpitations and shortness of breath: Her palpitations are worse. I started diltiazem at her last visit and she tried it for 3 months but her insurance company stopped paying for it and she was put back on metoprolol by her PCP. Given increase in symptoms but fatigue with metoprolol, will switch to short-acting diltiazem 60 mg bid. This can be titrated upwards at her next visit if she does not experience symptom relief.   2. Pulmonary HTN: More short of breath, associated with palpitations. Peak RV-RA gradient 30  mmHg by echo in 06/2012, which was improved compared to the prior (36 mmHg previously). She is compliant with CPAP. I think that her apparently mild pulmonary HTN may be due primarily to OSA and probable obesity-hypoventilation syndrome. I may consider a repeat echocardiogram at her next visit if dyspnea progresses.  3. Morbid obesity: She is morbidly obese and needs to work on weight loss with exercise. I recommend weight loss surgery.  4. Fatigue/abnormal ECG: Will proceed with 2-day Lexiscan to evaluate for ischemic heart disease.  Dispo: f/u 3 months.  Kate Sable, M.D., F.A.C.C.

## 2015-10-18 ENCOUNTER — Telehealth: Payer: Self-pay | Admitting: Cardiovascular Disease

## 2015-10-18 DIAGNOSIS — R9431 Abnormal electrocardiogram [ECG] [EKG]: Secondary | ICD-10-CM

## 2015-10-18 DIAGNOSIS — R5383 Other fatigue: Secondary | ICD-10-CM

## 2015-10-18 NOTE — Telephone Encounter (Signed)
Patient is requesting to have GXT because she doesn't want a nuclear study and also says that she is able to walk on a treadmill. Patient is also requesting the test be changed due to cost.

## 2015-10-18 NOTE — Telephone Encounter (Signed)
That is up to her, but it lacks the specificity of a nuclear study to detect ischemic heart disease. If she does not want a nuclear study, a GXT can be ordered.

## 2015-10-18 NOTE — Telephone Encounter (Signed)
Rachel Holland called stating that she is very nervous about taking the stress test.  She is also concerned about her Insurance paying.  She is wanting to know if Dr. Bronson Ing would consider a different type of testing.

## 2015-10-19 ENCOUNTER — Encounter: Payer: Self-pay | Admitting: *Deleted

## 2015-10-19 NOTE — Telephone Encounter (Signed)
Patient notified.  New order for GXT will be done & fwd [facility] for scheduling .

## 2015-10-25 ENCOUNTER — Encounter (HOSPITAL_COMMUNITY): Payer: PRIVATE HEALTH INSURANCE

## 2015-10-25 ENCOUNTER — Ambulatory Visit (HOSPITAL_COMMUNITY): Payer: PRIVATE HEALTH INSURANCE

## 2015-10-26 ENCOUNTER — Ambulatory Visit (HOSPITAL_COMMUNITY)
Admission: RE | Admit: 2015-10-26 | Discharge: 2015-10-26 | Disposition: A | Discharge status: Home / Self Care | Payer: PRIVATE HEALTH INSURANCE | Source: Ambulatory Visit | Attending: Cardiovascular Disease | Admitting: Cardiovascular Disease

## 2015-10-26 DIAGNOSIS — R5383 Other fatigue: Secondary | ICD-10-CM | POA: Insufficient documentation

## 2015-10-26 DIAGNOSIS — R9439 Abnormal result of other cardiovascular function study: Secondary | ICD-10-CM | POA: Insufficient documentation

## 2015-10-26 DIAGNOSIS — R9431 Abnormal electrocardiogram [ECG] [EKG]: Secondary | ICD-10-CM | POA: Diagnosis not present

## 2015-10-26 LAB — EXERCISE TOLERANCE TEST
CHL CUP RESTING HR STRESS: 83 {beats}/min
CHL RATE OF PERCEIVED EXERTION: 15
CSEPED: 2 min
CSEPEDS: 6 s
CSEPEW: 4.6 METS
MPHR: 175 {beats}/min
Peak HR: 153 {beats}/min
Percent HR: 87 %

## 2015-10-27 ENCOUNTER — Telehealth (INDEPENDENT_AMBULATORY_CARE_PROVIDER_SITE_OTHER): Payer: PRIVATE HEALTH INSURANCE | Admitting: *Deleted

## 2015-10-27 ENCOUNTER — Telehealth: Payer: Self-pay | Admitting: *Deleted

## 2015-10-27 DIAGNOSIS — R079 Chest pain, unspecified: Secondary | ICD-10-CM

## 2015-10-27 NOTE — Telephone Encounter (Signed)
-----   Message from Herminio Commons, MD sent at 10/26/2015  4:42 PM EDT ----- Submaximal stress test. Recommend nuclear imaging (2-day Lexiscan) for a more accurate assessment of ischemic heart disease.

## 2015-10-27 NOTE — Telephone Encounter (Signed)
Pt is there another test that can be done other that a nuclear study. She does not feel comfortable having her heart medically stimulated. Pt would also like to know if she could have her diltiazem decrease because it is making her dizzy. Please advise.

## 2015-10-27 NOTE — Telephone Encounter (Signed)
Called patient with test results. No answer. Left message to call back.  

## 2015-10-28 NOTE — Telephone Encounter (Signed)
Can decrease diltiazem to 30 mg BID. Can go for coronary calcium CTA at [facility].

## 2015-11-02 MED ORDER — DILTIAZEM HCL 30 MG PO TABS: 30.0000 mg | ORAL_TABLET | Freq: Two times a day (BID) | ORAL | 6 refills | Status: DC

## 2015-11-02 NOTE — Addendum Note (Signed)
Addended by: Levonne Hubert on: 11/02/2015 04:57 PM   Modules accepted: Orders

## 2015-11-09 NOTE — Telephone Encounter (Signed)
Patient calling back wanting to know if this has been done.  Stated that she did ger her medication, but has not heard back about scheduled the test.

## 2015-11-10 NOTE — Telephone Encounter (Signed)
Order replaced and T. Goins notified to make appt.

## 2015-11-10 NOTE — Addendum Note (Signed)
Addended by: Levonne Hubert on: 11/10/2015 04:37 PM   Modules accepted: Orders

## 2015-11-22 ENCOUNTER — Other Ambulatory Visit: Payer: Self-pay | Admitting: Cardiovascular Disease

## 2015-11-23 ENCOUNTER — Telehealth: Payer: Self-pay | Admitting: *Deleted

## 2015-11-23 DIAGNOSIS — R079 Chest pain, unspecified: Secondary | ICD-10-CM

## 2015-11-23 NOTE — Telephone Encounter (Signed)
-----   Message from Orinda Kenner sent at 11/22/2015 10:42 AM EDT ----- Regarding: RE: coronary calcium CTA  Please put in an order for this test. Order number is [id number].  Thanks, Coralyn Mark ----- Message ----- From: Levonne Hubert, LPN Sent: [id number]   4:57 PM To: Bertram Gala Goins Subject: coronary calcium CTA                           Pt needs coronary calcium CTA. Pt wt is 295 lb. To be done at [facility] or [place].

## 2015-12-01 ENCOUNTER — Inpatient Hospital Stay: Admission: RE | Admit: 2015-12-01 | Payer: PRIVATE HEALTH INSURANCE | Source: Ambulatory Visit

## 2015-12-14 ENCOUNTER — Inpatient Hospital Stay: Admission: RE | Admit: 2015-12-14 | Payer: PRIVATE HEALTH INSURANCE | Source: Ambulatory Visit

## 2016-01-28 ENCOUNTER — Ambulatory Visit: Payer: PRIVATE HEALTH INSURANCE | Admitting: Cardiovascular Disease

## 2016-03-31 ENCOUNTER — Ambulatory Visit: Payer: PRIVATE HEALTH INSURANCE | Admitting: Urology

## 2016-04-05 ENCOUNTER — Ambulatory Visit: Payer: PRIVATE HEALTH INSURANCE | Admitting: Urology

## 2016-07-24 ENCOUNTER — Encounter: Payer: Self-pay | Admitting: Cardiovascular Disease

## 2016-08-22 DIAGNOSIS — Z975 Presence of (intrauterine) contraceptive device: Secondary | ICD-10-CM | POA: Insufficient documentation

## 2016-08-22 DIAGNOSIS — G43909 Migraine, unspecified, not intractable, without status migrainosus: Secondary | ICD-10-CM | POA: Insufficient documentation

## 2016-11-08 DIAGNOSIS — F329 Major depressive disorder, single episode, unspecified: Secondary | ICD-10-CM | POA: Insufficient documentation

## 2016-11-13 ENCOUNTER — Encounter: Payer: Self-pay | Admitting: Gastroenterology

## 2016-12-01 ENCOUNTER — Encounter: Payer: Self-pay | Admitting: Gastroenterology

## 2016-12-01 ENCOUNTER — Ambulatory Visit (INDEPENDENT_AMBULATORY_CARE_PROVIDER_SITE_OTHER): Payer: PRIVATE HEALTH INSURANCE | Admitting: Gastroenterology

## 2016-12-01 VITALS — BP 132/86 | HR 69 | Temp 98.3°F | Ht 62.0 in | Wt 271.4 lb

## 2016-12-01 DIAGNOSIS — R1010 Upper abdominal pain, unspecified: Secondary | ICD-10-CM | POA: Insufficient documentation

## 2016-12-01 DIAGNOSIS — R1013 Epigastric pain: Secondary | ICD-10-CM

## 2016-12-01 DIAGNOSIS — R197 Diarrhea, unspecified: Secondary | ICD-10-CM | POA: Diagnosis not present

## 2016-12-01 DIAGNOSIS — R198 Other specified symptoms and signs involving the digestive system and abdomen: Secondary | ICD-10-CM | POA: Insufficient documentation

## 2016-12-01 DIAGNOSIS — F458 Other somatoform disorders: Secondary | ICD-10-CM | POA: Diagnosis not present

## 2016-12-01 DIAGNOSIS — R0989 Other specified symptoms and signs involving the circulatory and respiratory systems: Secondary | ICD-10-CM | POA: Insufficient documentation

## 2016-12-01 MED ORDER — PANTOPRAZOLE SODIUM 40 MG PO TBEC: 40.0000 mg | DELAYED_RELEASE_TABLET | Freq: Every day | ORAL | 3 refills | Status: DC

## 2016-12-01 NOTE — Assessment & Plan Note (Addendum)
46 year old female with several months' history of epigastric pain, associated N/V, worsened by eating. Reportedly had ultrasound of gallbladder without significant findings. Will request from [facility] and any other additional labs. Notable GERD, previously on Prilosec. Now only on Zantac. Due to persistent dyspepsia, will pursue EGD. Also notes globus sensation but no obvious solid food dysphagia. Likely secondary to uncontrolled GERD.  Proceed with upper endoscopy +/- dilation in the near future with Dr. Gala Romney. The risks, benefits, and alternatives have been discussed in detail with patient. They have stated understanding and desire to proceed.  PROPOFOL due to polypharmacy Stop Zantac. Start Protonix once each day.   ADDENDUM: no US abdomen from [facility].

## 2016-12-01 NOTE — Patient Instructions (Signed)
I would like for you to stop Zantac. Start taking the Protonix I sent to the pharmacy, 30 minutes before breakfast daily.  We have scheduled an upper endoscopy with dilatation by Dr. Gala Romney in the near future.   Please add daily fiber: Metamucil or Benefiber. I feel like this will help you.  I will see you after the procedure!  It was a pleasure to see you today. I strive to create trusting relationships with patients to provide genuine, compassionate, and quality care. I value your feedback. If you receive a survey regarding your visit,  I greatly appreciate you the taking time to fill this out.   Annitta Needs, PhD, ANP-BC [facility]

## 2016-12-01 NOTE — Assessment & Plan Note (Signed)
Episodes of diarrhea followed by days without any BM. Upon questioning, she states fiber provided resolution of her symptoms. I wonder if she may be actually baseline constipated with overflow issues. Will start her on supplemental fiber. No concerning lower GI signs/symptoms or red flags. Return after EGD for further evaluation.

## 2016-12-01 NOTE — Assessment & Plan Note (Signed)
Dilation as appropriate.  

## 2016-12-01 NOTE — Progress Notes (Signed)
Primary Care Physician:  Monico Blitz, MD Primary Gastroenterologist:  Dr. Gala Romney   Chief Complaint  Patient presents with  . Gastroesophageal Reflux    HPI:   Rachel Holland is a 46 y.o. female presenting today at the request of Monico Blitz, MD secondary to GERD.    Has had several months of upper abdominal pain. Intermittent. Worsened by eating, doesn't matter what it is. Went to ED the other evening. Associated N/V. Dr. Manuella Ghazi has done ultrasound and was told had fatty liver and gallbladder looked ok. Has tried omeprazole, now on Zantac. Will have reflux going up into chest with severe burning. Never tried Protonix. Has globus sensation but no dysphagia. Intermittent Ibuprofen about once to twice a week for headaches.   Has episodes of severe diarrhea and then will have several days without BM. Diarrhea will be yellow/clear. Will not feel constipated in between these episodes. Tried supplemental fiber for a few days, which helped. Will have a dizzy spell with the diarrhea. Dizziness for about year now. Prior to this would not have severe diarrhea. No rectal bleeding. No melena. Lost 25 lbs after going on the keto diet and cutting out sodas. Now scared to eat as she is afraid it will trigger symptoms.   Past Medical History:  Diagnosis Date  . Anxiety   . Dizziness   . GERD (gastroesophageal reflux disease)   . Hypertension   . Morbid obesity ([place])   . PVCs (premature ventricular contractions)     Past Surgical History:  Procedure Laterality Date  . arm surgery      Current Outpatient Prescriptions  Medication Sig Dispense Refill  . clonazePAM (KLONOPIN) 0.5 MG tablet Take 0.5 mg by mouth as needed for anxiety.     [name] FLUoxetine (PROZAC) 20 MG capsule daily.    . metoprolol succinate (TOPROL-XL) 25 MG 24 hr tablet Take 25 mg by mouth 2 (two) times daily.    . ranitidine (ZANTAC) 150 MG capsule Take 150 mg by mouth 2 (two) times daily.    . pantoprazole (PROTONIX) 40 MG  tablet Take 1 tablet (40 mg total) by mouth daily. Take 30 minutes prior to breakfast 30 tablet 3   No current facility-administered medications for this visit.     Allergies as of 12/01/2016 - Review Complete 12/01/2016  Allergen Reaction Noted  . Sulfa antibiotics Nausea And Vomiting 02/19/2012    Family History  Problem Relation Age of Onset  . Parkinson's disease Father        and some ectopic beats  . Prostate cancer Father   . Diverticulitis Mother   . Colon cancer Neg Hx     Social History   Social History  . Marital status: Married    Spouse name: N/A  . Number of children: N/A  . Years of education: N/A   Occupational History  . self-employed     cleans houses    Social History Main Topics  . Smoking status: Former Smoker    Packs/day: 1.00    Years: 20.00    Types: Cigarettes    Start date: 01/31/1984    Quit date: 01/31/2008  . Smokeless tobacco: Never Used  . Alcohol use No  . Drug use: No  . Sexual activity: Yes   Other Topics Concern  . Not on file   Social History Narrative  . No narrative on file    Review of Systems: Gen: see HPI  CV: Denies chest pain, heart palpitations, peripheral edema,  syncope.  Resp: Denies shortness of breath at rest or with exertion. Denies wheezing or cough.  GI:see HPI  GU : Denies urinary burning, urinary frequency, urinary hesitancy MS: Denies joint pain, muscle weakness, cramps, or limitation of movement.  Derm: Denies rash, itching, dry skin Psych: Denies depression, anxiety, memory loss, and confusion Heme: see HPI   Physical Exam: BP 132/86   Pulse 69   Temp 98.3 F (36.8 C) (Oral)   Ht 5\' 2"  (1.575 m)   Wt 271 lb 6.4 oz (123.1 kg)   LMP 11/16/2016   BMI 49.64 kg/m  General:   Alert and oriented. Pleasant and cooperative. Well-nourished and well-developed.  Head:  Normocephalic and atraumatic. Eyes:  Without icterus, sclera clear and conjunctiva pink.  Ears:  Normal auditory acuity. Nose:  No  deformity, discharge,  or lesions. Mouth:  No deformity or lesions, oral mucosa pink.  Lungs:  Clear to auscultation bilaterally. No wheezes, rales, or rhonchi. No distress.  Heart:  S1, S2 present without murmurs appreciated.  Abdomen:  +BS, soft, non-tender and non-distended. No HSM noted. No guarding or rebound. No masses appreciated.  Rectal:  Deferred  Msk:  Symmetrical without gross deformities. Normal posture. Extremities:  Without edema. Neurologic:  Alert and  oriented x4 Psych:  Alert and cooperative. Normal mood and affect.

## 2016-12-04 ENCOUNTER — Encounter: Payer: Self-pay | Admitting: *Deleted

## 2016-12-04 ENCOUNTER — Other Ambulatory Visit: Payer: Self-pay | Admitting: *Deleted

## 2016-12-04 ENCOUNTER — Telehealth: Payer: Self-pay | Admitting: *Deleted

## 2016-12-04 DIAGNOSIS — R0989 Other specified symptoms and signs involving the circulatory and respiratory systems: Secondary | ICD-10-CM

## 2016-12-04 DIAGNOSIS — R1013 Epigastric pain: Secondary | ICD-10-CM

## 2016-12-04 DIAGNOSIS — R198 Other specified symptoms and signs involving the digestive system and abdomen: Secondary | ICD-10-CM

## 2016-12-04 NOTE — Telephone Encounter (Signed)
Spoke with pt. She was scheduled for EGD/DIL W/ PROPOFOL on 01/10/17 but this was r/s'd to 01/17/17 at 1pm. She has been mailed instructions along with her pre-op appt 01/10/17 at 9am.

## 2016-12-05 NOTE — Progress Notes (Signed)
CC'D TO PCP °

## 2016-12-27 ENCOUNTER — Ambulatory Visit: Payer: PRIVATE HEALTH INSURANCE | Admitting: Gastroenterology

## 2017-01-04 ENCOUNTER — Other Ambulatory Visit (HOSPITAL_COMMUNITY): Payer: PRIVATE HEALTH INSURANCE

## 2017-01-09 NOTE — Patient Instructions (Signed)
Rachel Holland  01/09/2017     @PREFPERIOPPHARMACY @   Your procedure is scheduled on  01/17/2017   Report to [facility] at  1100  A.M.  Call this number if you have problems the morning of surgery:  [phone]   Remember:  Do not eat food or drink liquids after midnight.  Take these medicines the morning of surgery with A SIP OF WATER  Klonopin, prozac, metoprolol, protonix.   Do not wear jewelry, make-up or nail polish.  Do not wear lotions, powders, or perfumes, or deodorant.  Do not shave 48 hours prior to surgery.  Men may shave face and neck.  Do not bring valuables to the hospital.  [facility] is not responsible for any belongings or valuables.  Contacts, dentures or bridgework may not be worn into surgery.  Leave your suitcase in the car.  After surgery it may be brought to your room.  For patients admitted to the hospital, discharge time will be determined by your treatment team.  Patients discharged the day of surgery will not be allowed to drive home.   Name and phone number of your driver:   family Special instructions:   Follow the diet instructions given to you by Dr Roseanne Kaufman office.  Please read over the following fact sheets that you were given. Anesthesia Post-op Instructions and Care and Recovery After Surgery       Esophagogastroduodenoscopy Esophagogastroduodenoscopy (EGD) is a procedure to examine the lining of the esophagus, stomach, and first part of the small intestine (duodenum). This procedure is done to check for problems such as inflammation, bleeding, ulcers, or growths. During this procedure, a long, flexible, lighted tube with a camera attached (endoscope) is inserted down the throat. Tell a health care provider about:  Any allergies you have.  All medicines you are taking, including vitamins, herbs, eye drops, creams, and over-the-counter medicines.  Any problems you or family members have had with anesthetic  medicines.  Any blood disorders you have.  Any surgeries you have had.  Any medical conditions you have.  Whether you are pregnant or may be pregnant. What are the risks? Generally, this is a safe procedure. However, problems may occur, including:  Infection.  Bleeding.  A tear (perforation) in the esophagus, stomach, or duodenum.  Trouble breathing.  Excessive sweating.  Spasms of the larynx.  A slowed heartbeat.  Low blood pressure.  What happens before the procedure?  Follow instructions from your health care provider about eating or drinking restrictions.  Ask your health care provider about: ? Changing or stopping your regular medicines. This is especially important if you are taking diabetes medicines or blood thinners. ? Taking medicines such as aspirin and ibuprofen. These medicines can thin your blood. Do not take these medicines before your procedure if your health care provider instructs you not to.  Plan to have someone take you home after the procedure.  If you wear dentures, be ready to remove them before the procedure. What happens during the procedure?  To reduce your risk of infection, your health care team will wash or sanitize their hands.  An IV tube will be put in a vein in your hand or arm. You will get medicines and fluids through this tube.  You will be given one or more of the following: ? A medicine to help you relax (sedative). ? A medicine to numb the area (local anesthetic). This medicine  may be sprayed into your throat. It will make you feel more comfortable and keep you from gagging or coughing during the procedure. ? A medicine for pain.  A mouth guard may be placed in your mouth to protect your teeth and to keep you from biting on the endoscope.  You will be asked to lie on your left side.  The endoscope will be lowered down your throat into your esophagus, stomach, and duodenum.  Air will be put into the endoscope. This will  help your health care provider see better.  The lining of your esophagus, stomach, and duodenum will be examined.  Your health care provider may: ? Take a tissue sample so it can be looked at in a lab (biopsy). ? Remove growths. ? Remove objects (foreign bodies) that are stuck. ? Treat any bleeding with medicines or other devices that stop tissue from bleeding. ? Widen (dilate) or stretch narrowed areas of your esophagus and stomach.  The endoscope will be taken out. The procedure may vary among health care providers and hospitals. What happens after the procedure?  Your blood pressure, heart rate, breathing rate, and blood oxygen level will be monitored often until the medicines you were given have worn off.  Do not eat or drink anything until the numbing medicine has worn off and your gag reflex has returned. This information is not intended to replace advice given to you by your health care provider. Make sure you discuss any questions you have with your health care provider. Document Released: 05/19/2004 Document Revised: 06/24/2015 Document Reviewed: 12/10/2014 Elsevier Interactive Patient Education  2018 [employer]. Esophagogastroduodenoscopy, Care After Refer to this sheet in the next few weeks. These instructions provide you with information about caring for yourself after your procedure. Your health care provider may also give you more specific instructions. Your treatment has been planned according to current medical practices, but problems sometimes occur. Call your health care provider if you have any problems or questions after your procedure. What can I expect after the procedure? After the procedure, it is common to have:  A sore throat.  Nausea.  Bloating.  Dizziness.  Fatigue.  Follow these instructions at home:  Do not eat or drink anything until the numbing medicine (local anesthetic) has worn off and your gag reflex has returned. You will know that the  local anesthetic has worn off when you can swallow comfortably.  Do not drive for 24 hours if you received a medicine to help you relax (sedative).  If your health care provider took a tissue sample for testing during the procedure, make sure to get your test results. This is your responsibility. Ask your health care provider or the department performing the test when your results will be ready.  Keep all follow-up visits as told by your health care provider. This is important. Contact a health care provider if:  You cannot stop coughing.  You are not urinating.  You are urinating less than usual. Get help right away if:  You have trouble swallowing.  You cannot eat or drink.  You have throat or chest pain that gets worse.  You are dizzy or light-headed.  You faint.  You have nausea or vomiting.  You have chills.  You have a fever.  You have severe abdominal pain.  You have black, tarry, or bloody stools. This information is not intended to replace advice given to you by your health care provider. Make sure you discuss any questions you have  with your health care provider. Document Released: 01/03/2012 Document Revised: 06/24/2015 Document Reviewed: 12/10/2014 Elsevier Interactive Patient Education  2018 [employer].  Esophageal Dilatation Esophageal dilatation is a procedure to open a blocked or narrowed part of the esophagus. The esophagus is the long tube in your throat that carries food and liquid from your mouth to your stomach. The procedure is also called esophageal dilation. You may need this procedure if you have a buildup of scar tissue in your esophagus that makes it difficult, painful, or even impossible to swallow. This can be caused by gastroesophageal reflux disease (GERD). In rare cases, people need this procedure because they have cancer of the esophagus or a problem with the way food moves through the esophagus. Sometimes you may need to have another  dilatation to enlarge the opening of the esophagus gradually. Tell a health care provider about:  Any allergies you have.  All medicines you are taking, including vitamins, herbs, eye drops, creams, and over-the-counter medicines.  Any problems you or family members have had with anesthetic medicines.  Any blood disorders you have.  Any surgeries you have had.  Any medical conditions you have.  Any antibiotic medicines you are required to take before dental procedures. What are the risks? Generally, this is a safe procedure. However, problems can occur and include:  Bleeding from a tear in the lining of the esophagus.  A hole (perforation) in the esophagus.  What happens before the procedure?  Do not eat or drink anything after midnight on the night before the procedure or as directed by your health care provider.  Ask your health care provider about changing or stopping your regular medicines. This is especially important if you are taking diabetes medicines or blood thinners.  Plan to have someone take you home after the procedure. What happens during the procedure?  You will be given a medicine that makes you relaxed and sleepy (sedative).  A medicine may be sprayed or gargled to numb the back of the throat.  Your health care provider can use various instruments to do an esophageal dilatation. During the procedure, the instrument used will be placed in your mouth and passed down into your esophagus. Options include: ? Simple dilators. This instrument is carefully placed in the esophagus to stretch it. ? Guided wire bougies. In this method, a flexible tube (endoscope) is used to insert a wire into the esophagus. The dilator is passed over this wire to enlarge the esophagus. Then the wire is removed. ? Balloon dilators. An endoscope with a small balloon at the end is passed down into the esophagus. Inflating the balloon gently stretches the esophagus and opens it up. What  happens after the procedure?  Your blood pressure, heart rate, breathing rate, and blood oxygen level will be monitored often until the medicines you were given have worn off.  Your throat may feel slightly sore and will probably still feel numb. This will improve slowly over time.  You will not be allowed to eat or drink until the throat numbness has resolved.  If this is a same-day procedure, you may be allowed to go home once you have been able to drink, urinate, and sit on the edge of the bed without nausea or dizziness.  If this is a same-day procedure, you should have a friend or family member with you for the next 24 hours after the procedure. This information is not intended to replace advice given to you by your health care provider.  Make sure you discuss any questions you have with your health care provider. Document Released: 03/09/2005 Document Revised: 06/24/2015 Document Reviewed: 05/28/2013 Elsevier Interactive Patient Education  2017 [employer] Anesthesia is a term that refers to techniques, procedures, and medicines that help a person stay safe and comfortable during a medical procedure. Monitored anesthesia care, or sedation, is one type of anesthesia. Your anesthesia specialist may recommend sedation if you will be having a procedure that does not require you to be unconscious, such as:  Cataract surgery.  A dental procedure.  A biopsy.  A colonoscopy.  During the procedure, you may receive a medicine to help you relax (sedative). There are three levels of sedation:  Mild sedation. At this level, you may feel awake and relaxed. You will be able to follow directions.  Moderate sedation. At this level, you will be sleepy. You may not remember the procedure.  Deep sedation. At this level, you will be asleep. You will not remember the procedure.  The more medicine you are given, the deeper your level of sedation will be. Depending on how  you respond to the procedure, the anesthesia specialist may change your level of sedation or the type of anesthesia to fit your needs. An anesthesia specialist will monitor you closely during the procedure. Let your health care provider know about:  Any allergies you have.  All medicines you are taking, including vitamins, herbs, eye drops, creams, and over-the-counter medicines.  Any use of steroids (by mouth or as a cream).  Any problems you or family members have had with sedatives and anesthetic medicines.  Any blood disorders you have.  Any surgeries you have had.  Any medical conditions you have, such as sleep apnea.  Whether you are pregnant or may be pregnant.  Any use of cigarettes, alcohol, or street drugs. What are the risks? Generally, this is a safe procedure. However, problems may occur, including:  Getting too much medicine (oversedation).  Nausea.  Allergic reaction to medicines.  Trouble breathing. If this happens, a breathing tube may be used to help with breathing. It will be removed when you are awake and breathing on your own.  Heart trouble.  Lung trouble.  Before the procedure Staying hydrated Follow instructions from your health care provider about hydration, which may include:  Up to 2 hours before the procedure - you may continue to drink clear liquids, such as water, clear fruit juice, black coffee, and plain tea.  Eating and drinking restrictions Follow instructions from your health care provider about eating and drinking, which may include:  8 hours before the procedure - stop eating heavy meals or foods such as meat, fried foods, or fatty foods.  6 hours before the procedure - stop eating light meals or foods, such as toast or cereal.  6 hours before the procedure - stop drinking milk or drinks that contain milk.  2 hours before the procedure - stop drinking clear liquids.  Medicines Ask your health care provider about:  Changing or  stopping your regular medicines. This is especially important if you are taking diabetes medicines or blood thinners.  Taking medicines such as aspirin and ibuprofen. These medicines can thin your blood. Do not take these medicines before your procedure if your health care provider instructs you not to.  Tests and exams  You will have a physical exam.  You may have blood tests done to show: ? How well your kidneys and liver are working. ?  How well your blood can clot.  General instructions  Plan to have someone take you home from the hospital or clinic.  If you will be going home right after the procedure, plan to have someone with you for 24 hours.  What happens during the procedure?  Your blood pressure, heart rate, breathing, level of pain and overall condition will be monitored.  An IV tube will be inserted into one of your veins.  Your anesthesia specialist will give you medicines as needed to keep you comfortable during the procedure. This may mean changing the level of sedation.  The procedure will be performed. After the procedure  Your blood pressure, heart rate, breathing rate, and blood oxygen level will be monitored until the medicines you were given have worn off.  Do not drive for 24 hours if you received a sedative.  You may: ? Feel sleepy, clumsy, or nauseous. ? Feel forgetful about what happened after the procedure. ? Have a sore throat if you had a breathing tube during the procedure. ? Vomit. This information is not intended to replace advice given to you by your health care provider. Make sure you discuss any questions you have with your health care provider. Document Released: 10/12/2004 Document Revised: 06/25/2015 Document Reviewed: 05/09/2015 Elsevier Interactive Patient Education  2018 [place], Care After These instructions provide you with information about caring for yourself after your procedure. Your health care  provider may also give you more specific instructions. Your treatment has been planned according to current medical practices, but problems sometimes occur. Call your health care provider if you have any problems or questions after your procedure. What can I expect after the procedure? After your procedure, it is common to:  Feel sleepy for several hours.  Feel clumsy and have poor balance for several hours.  Feel forgetful about what happened after the procedure.  Have poor judgment for several hours.  Feel nauseous or vomit.  Have a sore throat if you had a breathing tube during the procedure.  Follow these instructions at home: For at least 24 hours after the procedure:   Do not: ? Participate in activities in which you could fall or become injured. ? Drive. ? Use heavy machinery. ? Drink alcohol. ? Take sleeping pills or medicines that cause drowsiness. ? Make important decisions or sign legal documents. ? Take care of children on your own.  Rest. Eating and drinking  Follow the diet that is recommended by your health care provider.  If you vomit, drink water, juice, or soup when you can drink without vomiting.  Make sure you have little or no nausea before eating solid foods. General instructions  Have a responsible adult stay with you until you are awake and alert.  Take over-the-counter and prescription medicines only as told by your health care provider.  If you smoke, do not smoke without supervision.  Keep all follow-up visits as told by your health care provider. This is important. Contact a health care provider if:  You keep feeling nauseous or you keep vomiting.  You feel light-headed.  You develop a rash.  You have a fever. Get help right away if:  You have trouble breathing. This information is not intended to replace advice given to you by your health care provider. Make sure you discuss any questions you have with your health care  provider. Document Released: 05/09/2015 Document Revised: 09/08/2015 Document Reviewed: 05/09/2015 Elsevier Interactive Patient Education  [employer].

## 2017-01-10 ENCOUNTER — Encounter (HOSPITAL_COMMUNITY)
Admission: RE | Admit: 2017-01-10 | Discharge: 2017-01-10 | Disposition: A | Discharge status: Home / Self Care | Payer: PRIVATE HEALTH INSURANCE | Source: Ambulatory Visit | Attending: Internal Medicine | Admitting: Internal Medicine

## 2017-01-15 ENCOUNTER — Encounter: Payer: Self-pay | Admitting: *Deleted

## 2017-01-15 ENCOUNTER — Encounter (HOSPITAL_COMMUNITY): Payer: Self-pay

## 2017-01-15 ENCOUNTER — Encounter (HOSPITAL_COMMUNITY): Admission: RE | Admit: 2017-01-15 | Payer: PRIVATE HEALTH INSURANCE | Source: Ambulatory Visit

## 2017-01-15 ENCOUNTER — Telehealth: Payer: Self-pay | Admitting: Internal Medicine

## 2017-01-15 NOTE — Telephone Encounter (Signed)
Pt has procedure with [facility] this Wednesday and needs to reschedule due to her work. Please call her at [phone]

## 2017-01-15 NOTE — Telephone Encounter (Signed)
Spoke with pt and she had to reschedule her EGD/DIL W/ propofol from 01/17/17 to 02/26/17 at 1:45pm. She is unable to get off work for her original scheduled date. New instructions mailed to patient.  Pre-op appt scheduled for 02/20/17 at 10:00am. Patient also aware. Letter mailed.

## 2017-02-14 NOTE — Patient Instructions (Signed)
Rachel Holland  02/14/2017     @PREFPERIOPPHARMACY @   Your procedure is scheduled on  02/26/2017   Report to [facility] at  1200  P.M.  Call this number if you have problems the morning of surgery:  [phone]   Remember:  Do not eat food or drink liquids after midnight.  Take these medicines the morning of surgery with A SIP OF WATER  Klonopin, prozac, metoprolol, protonix.   Do not wear jewelry, make-up or nail polish.  Do not wear lotions, powders, or perfumes, or deodorant.  Do not shave 48 hours prior to surgery.  Men may shave face and neck.  Do not bring valuables to the hospital.  [facility] is not responsible for any belongings or valuables.  Contacts, dentures or bridgework may not be worn into surgery.  Leave your suitcase in the car.  After surgery it may be brought to your room.  For patients admitted to the hospital, discharge time will be determined by your treatment team.  Patients discharged the day of surgery will not be allowed to drive home.   Name and phone number of your driver:   family Special instructions:  Follow the diet instructions given to you by Dr Roseanne Kaufman office.  Please read over the following fact sheets that you were given. Anesthesia Post-op Instructions and Care and Recovery After Surgery       Esophagogastroduodenoscopy Esophagogastroduodenoscopy (EGD) is a procedure to examine the lining of the esophagus, stomach, and first part of the small intestine (duodenum). This procedure is done to check for problems such as inflammation, bleeding, ulcers, or growths. During this procedure, a long, flexible, lighted tube with a camera attached (endoscope) is inserted down the throat. Tell a health care provider about:  Any allergies you have.  All medicines you are taking, including vitamins, herbs, eye drops, creams, and over-the-counter medicines.  Any problems you or family members have had with anesthetic  medicines.  Any blood disorders you have.  Any surgeries you have had.  Any medical conditions you have.  Whether you are pregnant or may be pregnant. What are the risks? Generally, this is a safe procedure. However, problems may occur, including:  Infection.  Bleeding.  A tear (perforation) in the esophagus, stomach, or duodenum.  Trouble breathing.  Excessive sweating.  Spasms of the larynx.  A slowed heartbeat.  Low blood pressure.  What happens before the procedure?  Follow instructions from your health care provider about eating or drinking restrictions.  Ask your health care provider about: ? Changing or stopping your regular medicines. This is especially important if you are taking diabetes medicines or blood thinners. ? Taking medicines such as aspirin and ibuprofen. These medicines can thin your blood. Do not take these medicines before your procedure if your health care provider instructs you not to.  Plan to have someone take you home after the procedure.  If you wear dentures, be ready to remove them before the procedure. What happens during the procedure?  To reduce your risk of infection, your health care team will wash or sanitize their hands.  An IV tube will be put in a vein in your hand or arm. You will get medicines and fluids through this tube.  You will be given one or more of the following: ? A medicine to help you relax (sedative). ? A medicine to numb the area (local anesthetic). This medicine may  be sprayed into your throat. It will make you feel more comfortable and keep you from gagging or coughing during the procedure. ? A medicine for pain.  A mouth guard may be placed in your mouth to protect your teeth and to keep you from biting on the endoscope.  You will be asked to lie on your left side.  The endoscope will be lowered down your throat into your esophagus, stomach, and duodenum.  Air will be put into the endoscope. This will  help your health care provider see better.  The lining of your esophagus, stomach, and duodenum will be examined.  Your health care provider may: ? Take a tissue sample so it can be looked at in a lab (biopsy). ? Remove growths. ? Remove objects (foreign bodies) that are stuck. ? Treat any bleeding with medicines or other devices that stop tissue from bleeding. ? Widen (dilate) or stretch narrowed areas of your esophagus and stomach.  The endoscope will be taken out. The procedure may vary among health care providers and hospitals. What happens after the procedure?  Your blood pressure, heart rate, breathing rate, and blood oxygen level will be monitored often until the medicines you were given have worn off.  Do not eat or drink anything until the numbing medicine has worn off and your gag reflex has returned. This information is not intended to replace advice given to you by your health care provider. Make sure you discuss any questions you have with your health care provider. Document Released: 05/19/2004 Document Revised: 06/24/2015 Document Reviewed: 12/10/2014 Elsevier Interactive Patient Education  2018 [employer]. Esophagogastroduodenoscopy, Care After Refer to this sheet in the next few weeks. These instructions provide you with information about caring for yourself after your procedure. Your health care provider may also give you more specific instructions. Your treatment has been planned according to current medical practices, but problems sometimes occur. Call your health care provider if you have any problems or questions after your procedure. What can I expect after the procedure? After the procedure, it is common to have:  A sore throat.  Nausea.  Bloating.  Dizziness.  Fatigue.  Follow these instructions at home:  Do not eat or drink anything until the numbing medicine (local anesthetic) has worn off and your gag reflex has returned. You will know that the  local anesthetic has worn off when you can swallow comfortably.  Do not drive for 24 hours if you received a medicine to help you relax (sedative).  If your health care provider took a tissue sample for testing during the procedure, make sure to get your test results. This is your responsibility. Ask your health care provider or the department performing the test when your results will be ready.  Keep all follow-up visits as told by your health care provider. This is important. Contact a health care provider if:  You cannot stop coughing.  You are not urinating.  You are urinating less than usual. Get help right away if:  You have trouble swallowing.  You cannot eat or drink.  You have throat or chest pain that gets worse.  You are dizzy or light-headed.  You faint.  You have nausea or vomiting.  You have chills.  You have a fever.  You have severe abdominal pain.  You have black, tarry, or bloody stools. This information is not intended to replace advice given to you by your health care provider. Make sure you discuss any questions you have with  your health care provider. Document Released: 01/03/2012 Document Revised: 06/24/2015 Document Reviewed: 12/10/2014 Elsevier Interactive Patient Education  2018 [employer].  Esophageal Dilatation Esophageal dilatation is a procedure to open a blocked or narrowed part of the esophagus. The esophagus is the long tube in your throat that carries food and liquid from your mouth to your stomach. The procedure is also called esophageal dilation. You may need this procedure if you have a buildup of scar tissue in your esophagus that makes it difficult, painful, or even impossible to swallow. This can be caused by gastroesophageal reflux disease (GERD). In rare cases, people need this procedure because they have cancer of the esophagus or a problem with the way food moves through the esophagus. Sometimes you may need to have another  dilatation to enlarge the opening of the esophagus gradually. Tell a health care provider about:  Any allergies you have.  All medicines you are taking, including vitamins, herbs, eye drops, creams, and over-the-counter medicines.  Any problems you or family members have had with anesthetic medicines.  Any blood disorders you have.  Any surgeries you have had.  Any medical conditions you have.  Any antibiotic medicines you are required to take before dental procedures. What are the risks? Generally, this is a safe procedure. However, problems can occur and include:  Bleeding from a tear in the lining of the esophagus.  A hole (perforation) in the esophagus.  What happens before the procedure?  Do not eat or drink anything after midnight on the night before the procedure or as directed by your health care provider.  Ask your health care provider about changing or stopping your regular medicines. This is especially important if you are taking diabetes medicines or blood thinners.  Plan to have someone take you home after the procedure. What happens during the procedure?  You will be given a medicine that makes you relaxed and sleepy (sedative).  A medicine may be sprayed or gargled to numb the back of the throat.  Your health care provider can use various instruments to do an esophageal dilatation. During the procedure, the instrument used will be placed in your mouth and passed down into your esophagus. Options include: ? Simple dilators. This instrument is carefully placed in the esophagus to stretch it. ? Guided wire bougies. In this method, a flexible tube (endoscope) is used to insert a wire into the esophagus. The dilator is passed over this wire to enlarge the esophagus. Then the wire is removed. ? Balloon dilators. An endoscope with a small balloon at the end is passed down into the esophagus. Inflating the balloon gently stretches the esophagus and opens it up. What  happens after the procedure?  Your blood pressure, heart rate, breathing rate, and blood oxygen level will be monitored often until the medicines you were given have worn off.  Your throat may feel slightly sore and will probably still feel numb. This will improve slowly over time.  You will not be allowed to eat or drink until the throat numbness has resolved.  If this is a same-day procedure, you may be allowed to go home once you have been able to drink, urinate, and sit on the edge of the bed without nausea or dizziness.  If this is a same-day procedure, you should have a friend or family member with you for the next 24 hours after the procedure. This information is not intended to replace advice given to you by your health care provider. Make  sure you discuss any questions you have with your health care provider. Document Released: 03/09/2005 Document Revised: 06/24/2015 Document Reviewed: 05/28/2013 Elsevier Interactive Patient Education  2018 [place] Anesthesia is a term that refers to techniques, procedures, and medicines that help a person stay safe and comfortable during a medical procedure. Monitored anesthesia care, or sedation, is one type of anesthesia. Your anesthesia specialist may recommend sedation if you will be having a procedure that does not require you to be unconscious, such as:  Cataract surgery.  A dental procedure.  A biopsy.  A colonoscopy.  During the procedure, you may receive a medicine to help you relax (sedative). There are three levels of sedation:  Mild sedation. At this level, you may feel awake and relaxed. You will be able to follow directions.  Moderate sedation. At this level, you will be sleepy. You may not remember the procedure.  Deep sedation. At this level, you will be asleep. You will not remember the procedure.  The more medicine you are given, the deeper your level of sedation will be. Depending on how  you respond to the procedure, the anesthesia specialist may change your level of sedation or the type of anesthesia to fit your needs. An anesthesia specialist will monitor you closely during the procedure. Let your health care provider know about:  Any allergies you have.  All medicines you are taking, including vitamins, herbs, eye drops, creams, and over-the-counter medicines.  Any use of steroids (by mouth or as a cream).  Any problems you or family members have had with sedatives and anesthetic medicines.  Any blood disorders you have.  Any surgeries you have had.  Any medical conditions you have, such as sleep apnea.  Whether you are pregnant or may be pregnant.  Any use of cigarettes, alcohol, or street drugs. What are the risks? Generally, this is a safe procedure. However, problems may occur, including:  Getting too much medicine (oversedation).  Nausea.  Allergic reaction to medicines.  Trouble breathing. If this happens, a breathing tube may be used to help with breathing. It will be removed when you are awake and breathing on your own.  Heart trouble.  Lung trouble.  Before the procedure Staying hydrated Follow instructions from your health care provider about hydration, which may include:  Up to 2 hours before the procedure - you may continue to drink clear liquids, such as water, clear fruit juice, black coffee, and plain tea.  Eating and drinking restrictions Follow instructions from your health care provider about eating and drinking, which may include:  8 hours before the procedure - stop eating heavy meals or foods such as meat, fried foods, or fatty foods.  6 hours before the procedure - stop eating light meals or foods, such as toast or cereal.  6 hours before the procedure - stop drinking milk or drinks that contain milk.  2 hours before the procedure - stop drinking clear liquids.  Medicines Ask your health care provider about:  Changing or  stopping your regular medicines. This is especially important if you are taking diabetes medicines or blood thinners.  Taking medicines such as aspirin and ibuprofen. These medicines can thin your blood. Do not take these medicines before your procedure if your health care provider instructs you not to.  Tests and exams  You will have a physical exam.  You may have blood tests done to show: ? How well your kidneys and liver are working. ? How  well your blood can clot.  General instructions  Plan to have someone take you home from the hospital or clinic.  If you will be going home right after the procedure, plan to have someone with you for 24 hours.  What happens during the procedure?  Your blood pressure, heart rate, breathing, level of pain and overall condition will be monitored.  An IV tube will be inserted into one of your veins.  Your anesthesia specialist will give you medicines as needed to keep you comfortable during the procedure. This may mean changing the level of sedation.  The procedure will be performed. After the procedure  Your blood pressure, heart rate, breathing rate, and blood oxygen level will be monitored until the medicines you were given have worn off.  Do not drive for 24 hours if you received a sedative.  You may: ? Feel sleepy, clumsy, or nauseous. ? Feel forgetful about what happened after the procedure. ? Have a sore throat if you had a breathing tube during the procedure. ? Vomit. This information is not intended to replace advice given to you by your health care provider. Make sure you discuss any questions you have with your health care provider. Document Released: 10/12/2004 Document Revised: 06/25/2015 Document Reviewed: 05/09/2015 Elsevier Interactive Patient Education  2018 [place], Care After These instructions provide you with information about caring for yourself after your procedure. Your health care  provider may also give you more specific instructions. Your treatment has been planned according to current medical practices, but problems sometimes occur. Call your health care provider if you have any problems or questions after your procedure. What can I expect after the procedure? After your procedure, it is common to:  Feel sleepy for several hours.  Feel clumsy and have poor balance for several hours.  Feel forgetful about what happened after the procedure.  Have poor judgment for several hours.  Feel nauseous or vomit.  Have a sore throat if you had a breathing tube during the procedure.  Follow these instructions at home: For at least 24 hours after the procedure:   Do not: ? Participate in activities in which you could fall or become injured. ? Drive. ? Use heavy machinery. ? Drink alcohol. ? Take sleeping pills or medicines that cause drowsiness. ? Make important decisions or sign legal documents. ? Take care of children on your own.  Rest. Eating and drinking  Follow the diet that is recommended by your health care provider.  If you vomit, drink water, juice, or soup when you can drink without vomiting.  Make sure you have little or no nausea before eating solid foods. General instructions  Have a responsible adult stay with you until you are awake and alert.  Take over-the-counter and prescription medicines only as told by your health care provider.  If you smoke, do not smoke without supervision.  Keep all follow-up visits as told by your health care provider. This is important. Contact a health care provider if:  You keep feeling nauseous or you keep vomiting.  You feel light-headed.  You develop a rash.  You have a fever. Get help right away if:  You have trouble breathing. This information is not intended to replace advice given to you by your health care provider. Make sure you discuss any questions you have with your health care  provider. Document Released: 05/09/2015 Document Revised: 09/08/2015 Document Reviewed: 05/09/2015 Elsevier Interactive Patient Education  [employer].

## 2017-02-20 ENCOUNTER — Encounter (HOSPITAL_COMMUNITY): Payer: Self-pay

## 2017-02-20 ENCOUNTER — Encounter (HOSPITAL_COMMUNITY)
Admission: RE | Admit: 2017-02-20 | Discharge: 2017-02-20 | Disposition: A | Discharge status: Home / Self Care | Payer: BLUE CROSS/BLUE SHIELD | Source: Ambulatory Visit | Attending: Internal Medicine | Admitting: Internal Medicine

## 2017-02-20 ENCOUNTER — Other Ambulatory Visit: Payer: Self-pay

## 2017-02-20 DIAGNOSIS — Z01818 Encounter for other preprocedural examination: Secondary | ICD-10-CM | POA: Insufficient documentation

## 2017-02-20 DIAGNOSIS — Z01812 Encounter for preprocedural laboratory examination: Secondary | ICD-10-CM | POA: Insufficient documentation

## 2017-02-20 HISTORY — DX: Depression, unspecified: F32.A

## 2017-02-20 HISTORY — DX: Sleep apnea, unspecified: G47.30

## 2017-02-20 HISTORY — DX: Major depressive disorder, single episode, unspecified: F32.9

## 2017-02-20 HISTORY — DX: Cardiac arrhythmia, unspecified: I49.9

## 2017-02-20 LAB — BASIC METABOLIC PANEL
ANION GAP: 10 (ref 5–15)
BUN: 14 mg/dL (ref 6–20)
CALCIUM: 9 mg/dL (ref 8.9–10.3)
CO2: 28 mmol/L (ref 22–32)
CREATININE: 0.79 mg/dL (ref 0.44–1.00)
Chloride: 100 mmol/L — ABNORMAL LOW (ref 101–111)
GFR calc Af Amer: >60 mL/min (ref >60)
GLUCOSE: 95 mg/dL (ref 65–99)
Potassium: 4.2 mmol/L (ref 3.5–5.1)
Sodium: 138 mmol/L (ref 135–145)

## 2017-02-20 LAB — CBC WITH DIFFERENTIAL/PLATELET
Basophils Absolute: 0.1 10*3/uL (ref 0.0–0.1)
Basophils Relative: 1 %
EOS PCT: 2 %
Eosinophils Absolute: 0.2 10*3/uL (ref 0.0–0.7)
HEMATOCRIT: 40.7 % (ref 36.0–46.0)
Hemoglobin: 13.1 g/dL (ref 12.0–15.0)
LYMPHS ABS: 2.6 10*3/uL (ref 0.7–4.0)
LYMPHS PCT: 26 %
MCH: 29 pg (ref 26.0–34.0)
MCHC: 32.2 g/dL (ref 30.0–36.0)
MCV: 90.2 fL (ref 78.0–100.0)
MONO ABS: 0.7 10*3/uL (ref 0.1–1.0)
Monocytes Relative: 7 %
NEUTROS ABS: 6.3 10*3/uL (ref 1.7–7.7)
Neutrophils Relative %: 64 %
PLATELETS: 308 10*3/uL (ref 150–400)
RBC: 4.51 MIL/uL (ref 3.87–5.11)
RDW: 13.8 % (ref 11.5–15.5)
WBC: 9.8 10*3/uL (ref 4.0–10.5)

## 2017-02-20 LAB — HCG, SERUM, QUALITATIVE: Preg, Serum: NEGATIVE

## 2017-02-26 ENCOUNTER — Ambulatory Visit (HOSPITAL_COMMUNITY): Payer: BLUE CROSS/BLUE SHIELD | Admitting: Anesthesiology

## 2017-02-26 ENCOUNTER — Encounter (HOSPITAL_COMMUNITY)
Admission: RE | Disposition: A | Discharge status: Home / Self Care | Payer: Self-pay | Source: Ambulatory Visit | Attending: Internal Medicine

## 2017-02-26 ENCOUNTER — Other Ambulatory Visit: Payer: Self-pay

## 2017-02-26 ENCOUNTER — Encounter (HOSPITAL_COMMUNITY): Payer: Self-pay

## 2017-02-26 ENCOUNTER — Ambulatory Visit (HOSPITAL_COMMUNITY)
Admission: RE | Admit: 2017-02-26 | Discharge: 2017-02-26 | Disposition: A | Discharge status: Home / Self Care | Payer: BLUE CROSS/BLUE SHIELD | Source: Ambulatory Visit | Attending: Internal Medicine | Admitting: Internal Medicine

## 2017-02-26 DIAGNOSIS — K219 Gastro-esophageal reflux disease without esophagitis: Secondary | ICD-10-CM | POA: Diagnosis not present

## 2017-02-26 DIAGNOSIS — Z8042 Family history of malignant neoplasm of prostate: Secondary | ICD-10-CM | POA: Insufficient documentation

## 2017-02-26 DIAGNOSIS — F419 Anxiety disorder, unspecified: Secondary | ICD-10-CM | POA: Diagnosis not present

## 2017-02-26 DIAGNOSIS — R1310 Dysphagia, unspecified: Secondary | ICD-10-CM | POA: Diagnosis present

## 2017-02-26 DIAGNOSIS — Z882 Allergy status to sulfonamides status: Secondary | ICD-10-CM | POA: Insufficient documentation

## 2017-02-26 DIAGNOSIS — Z79899 Other long term (current) drug therapy: Secondary | ICD-10-CM | POA: Diagnosis not present

## 2017-02-26 DIAGNOSIS — K449 Diaphragmatic hernia without obstruction or gangrene: Secondary | ICD-10-CM | POA: Diagnosis not present

## 2017-02-26 DIAGNOSIS — G4730 Sleep apnea, unspecified: Secondary | ICD-10-CM | POA: Diagnosis not present

## 2017-02-26 DIAGNOSIS — Z820 Family history of epilepsy and other diseases of the nervous system: Secondary | ICD-10-CM | POA: Insufficient documentation

## 2017-02-26 DIAGNOSIS — F329 Major depressive disorder, single episode, unspecified: Secondary | ICD-10-CM | POA: Insufficient documentation

## 2017-02-26 DIAGNOSIS — I493 Ventricular premature depolarization: Secondary | ICD-10-CM | POA: Insufficient documentation

## 2017-02-26 DIAGNOSIS — R42 Dizziness and giddiness: Secondary | ICD-10-CM | POA: Diagnosis not present

## 2017-02-26 DIAGNOSIS — Z87891 Personal history of nicotine dependence: Secondary | ICD-10-CM | POA: Insufficient documentation

## 2017-02-26 DIAGNOSIS — I10 Essential (primary) hypertension: Secondary | ICD-10-CM | POA: Insufficient documentation

## 2017-02-26 DIAGNOSIS — Z8379 Family history of other diseases of the digestive system: Secondary | ICD-10-CM | POA: Diagnosis not present

## 2017-02-26 DIAGNOSIS — I499 Cardiac arrhythmia, unspecified: Secondary | ICD-10-CM | POA: Diagnosis not present

## 2017-02-26 HISTORY — PX: ESOPHAGOGASTRODUODENOSCOPY (EGD) WITH PROPOFOL: SHX5813

## 2017-02-26 HISTORY — PX: MALONEY DILATION: SHX5535

## 2017-02-26 SURGERY — ESOPHAGOGASTRODUODENOSCOPY (EGD) WITH PROPOFOL
Anesthesia: Monitor Anesthesia Care

## 2017-02-26 MED ORDER — MIDAZOLAM HCL 2 MG/2ML IJ SOLN
INTRAMUSCULAR | Status: AC
  Filled 2017-02-26: qty 2

## 2017-02-26 MED ORDER — MIDAZOLAM HCL 2 MG/2ML IJ SOLN
1.0000 mg | Freq: Once | INTRAMUSCULAR | Status: AC | PRN
  Administered 2017-02-26: 2 mg via INTRAVENOUS

## 2017-02-26 MED ORDER — LACTATED RINGERS IV SOLN
INTRAVENOUS | Status: DC
  Administered 2017-02-26: 11:00:00 via INTRAVENOUS

## 2017-02-26 MED ORDER — ONDANSETRON 4 MG PO TBDP
ORAL_TABLET | ORAL | Status: AC
  Filled 2017-02-26: qty 1

## 2017-02-26 MED ORDER — PROPOFOL 10 MG/ML IV BOLUS
INTRAVENOUS | Status: DC | PRN
  Administered 2017-02-26: 20 mg via INTRAVENOUS
  Administered 2017-02-26 (×2): 10 mg via INTRAVENOUS
  Administered 2017-02-26: 20 mg via INTRAVENOUS

## 2017-02-26 MED ORDER — LIDOCAINE VISCOUS 2 % MT SOLN
15.0000 mL | Freq: Once | OROMUCOSAL | Status: AC
  Administered 2017-02-26: 15 mL via OROMUCOSAL

## 2017-02-26 MED ORDER — LIDOCAINE VISCOUS 2 % MT SOLN
OROMUCOSAL | Status: AC
  Filled 2017-02-26: qty 15

## 2017-02-26 MED ORDER — ONDANSETRON 4 MG PO TBDP
4.0000 mg | ORAL_TABLET | Freq: Once | ORAL | Status: AC
  Administered 2017-02-26: 4 mg via ORAL

## 2017-02-26 MED ORDER — PROPOFOL 10 MG/ML IV BOLUS: INTRAVENOUS | Status: DC | PRN

## 2017-02-26 MED ORDER — PROPOFOL 500 MG/50ML IV EMUL
INTRAVENOUS | Status: DC | PRN
  Administered 2017-02-26: 100 ug/kg/min via INTRAVENOUS
  Administered 2017-02-26: 12:00:00 via INTRAVENOUS

## 2017-02-26 MED ORDER — MIDAZOLAM HCL 5 MG/5ML IJ SOLN
INTRAMUSCULAR | Status: DC | PRN
  Administered 2017-02-26: 2 mg via INTRAVENOUS

## 2017-02-26 NOTE — Progress Notes (Signed)
Phone call to patient. Prescription for Protonix left in a chair in Postoperative room 7.  Will call in to [facility] in [place], [place] at patient request.

## 2017-02-26 NOTE — Addendum Note (Signed)
Addendum  created 02/26/17 1219 by Ollen Bowl, CRNA   Charge Capture section accepted

## 2017-02-26 NOTE — Discharge Instructions (Signed)
EGD Discharge instructions Please read the instructions outlined below and refer to this sheet in the next few weeks. These discharge instructions provide you with general information on caring for yourself after you leave the hospital. Your doctor may also give you specific instructions. While your treatment has been planned according to the most current medical practices available, unavoidable complications occasionally occur. If you have any problems or questions after discharge, please call your doctor. ACTIVITY  You may resume your regular activity but move at a slower pace for the next 24 hours.   Take frequent rest periods for the next 24 hours.   Walking will help expel (get rid of) the air and reduce the bloated feeling in your abdomen.   No driving for 24 hours (because of the anesthesia (medicine) used during the test).   You may shower.   Do not sign any important legal documents or operate any machinery for 24 hours (because of the anesthesia used during the test).  NUTRITION  Drink plenty of fluids.   You may resume your normal diet.   Begin with a light meal and progress to your normal diet.   Avoid alcoholic beverages for 24 hours or as instructed by your caregiver.  MEDICATIONS  You may resume your normal medications unless your caregiver tells you otherwise.  WHAT YOU CAN EXPECT TODAY  You may experience abdominal discomfort such as a feeling of fullness or gas pains.  FOLLOW-UP  Your doctor will discuss the results of your test with you.  SEEK IMMEDIATE MEDICAL ATTENTION IF ANY OF THE FOLLOWING OCCUR:  Excessive nausea (feeling sick to your stomach) and/or vomiting.   Severe abdominal pain and distention (swelling).   Trouble swallowing.   Temperature over 101 F (37.8 C).   Rectal bleeding or vomiting of blood.    GERD information provided  Increase pantoprazole to 40 mg twice daily  Office visit with [place] in 6  weeks       Gastroesophageal Reflux Disease, Adult Normally, food travels down the esophagus and stays in the stomach to be digested. If a person has gastroesophageal reflux disease (GERD), food and stomach acid move back up into the esophagus. When this happens, the esophagus becomes sore and swollen (inflamed). Over time, GERD can make small holes (ulcers) in the lining of the esophagus. Follow these instructions at home: Diet  Follow a diet as told by your doctor. You may need to avoid foods and drinks such as: ? Coffee and tea (with or without caffeine). ? Drinks that contain alcohol. ? Energy drinks and sports drinks. ? Carbonated drinks or sodas. ? Chocolate and cocoa. ? Peppermint and mint flavorings. ? Garlic and onions. ? Horseradish. ? Spicy and acidic foods, such as peppers, chili powder, curry powder, vinegar, hot sauces, and BBQ sauce. ? Citrus fruit juices and citrus fruits, such as oranges, lemons, and limes. ? Tomato-based foods, such as red sauce, chili, salsa, and pizza with red sauce. ? Fried and fatty foods, such as donuts, french fries, potato chips, and high-fat dressings. ? High-fat meats, such as hot dogs, rib eye steak, sausage, ham, and bacon. ? High-fat dairy items, such as whole milk, butter, and cream cheese.  Eat small meals often. Avoid eating large meals.  Avoid drinking large amounts of liquid with your meals.  Avoid eating meals during the 2-3 hours before bedtime.  Avoid lying down right after you eat.  Do not exercise right after you eat. General instructions  Pay attention to  any changes in your symptoms.  Take over-the-counter and prescription medicines only as told by your doctor. Do not take aspirin, ibuprofen, or other NSAIDs unless your doctor says it is okay.  Do not use any tobacco products, including cigarettes, chewing tobacco, and e-cigarettes. If you need help quitting, ask your doctor.  Wear loose clothes. Do not wear  anything tight around your waist.  Raise (elevate) the head of your bed about 6 inches (15 cm).  Try to lower your stress. If you need help doing this, ask your doctor.  If you are overweight, lose an amount of weight that is healthy for you. Ask your doctor about a safe weight loss goal.  Keep all follow-up visits as told by your doctor. This is important. Contact a doctor if:  You have new symptoms.  You lose weight and you do not know why it is happening.  You have trouble swallowing, or it hurts to swallow.  You have wheezing or a cough that keeps happening.  Your symptoms do not get better with treatment.  You have a hoarse voice. Get help right away if:  You have pain in your arms, neck, jaw, teeth, or back.  You feel sweaty, dizzy, or light-headed.  You have chest pain or shortness of breath.  You throw up (vomit) and your throw up looks like blood or coffee grounds.  You pass out (faint).  Your poop (stool) is bloody or black.  You cannot swallow, drink, or eat. This information is not intended to replace advice given to you by your health care provider. Make sure you discuss any questions you have with your health care provider. Document Released: 07/05/2007 Document Revised: 06/24/2015 Document Reviewed: 05/13/2014 Elsevier Interactive Patient Education  2018 [place] POST-ANESTHESIA  IMMEDIATELY FOLLOWING SURGERY:  Do not drive or operate machinery for the first twenty four hours after surgery.  Do not make any important decisions for twenty four hours after surgery or while taking narcotic pain medications or sedatives.  If you develop intractable nausea and vomiting or a severe headache please notify your doctor immediately.  FOLLOW-UP:  Please make an appointment with your surgeon as instructed. You do not need to follow up with anesthesia unless specifically instructed to do so.  WOUND CARE INSTRUCTIONS (if applicable):   Keep a dry clean dressing on the anesthesia/puncture wound site if there is drainage.  Once the wound has quit draining you may leave it open to air.  Generally you should leave the bandage intact for twenty four hours unless there is drainage.  If the epidural site drains for more than 36-48 hours please call the anesthesia department.  QUESTIONS?:  Please feel free to call your physician or the hospital operator if you have any questions, and they will be happy to assist you.

## 2017-02-26 NOTE — Anesthesia Postprocedure Evaluation (Signed)
Anesthesia Post Note  Patient: Rachel Holland  Procedure(s) Performed: ESOPHAGOGASTRODUODENOSCOPY (EGD) WITH PROPOFOL (N/A ) MALONEY DILATION (N/A )  Patient location during evaluation: PACU Anesthesia Type: MAC Level of consciousness: awake and alert and oriented Pain management: pain level controlled Vital Signs Assessment: post-procedure vital signs reviewed and stable Respiratory status: spontaneous breathing Cardiovascular status: stable Postop Assessment: no apparent nausea or vomiting Anesthetic complications: no     Last Vitals:  Vitals:   02/26/17 1105 02/26/17 1110  BP: 140/80 (!) 151/75  Pulse:    Resp: 16 16  Temp:    SpO2: 100% 100%    Last Pain:  Vitals:   02/26/17 1040  TempSrc: Oral                 Arelis Neumeier

## 2017-02-26 NOTE — Transfer of Care (Signed)
Immediate Anesthesia Transfer of Care Note  Patient: Rachel Holland  Procedure(s) Performed: ESOPHAGOGASTRODUODENOSCOPY (EGD) WITH PROPOFOL (N/A ) MALONEY DILATION (N/A )  Patient Location: PACU  Anesthesia Type:MAC  Level of Consciousness: awake and alert   Airway & Oxygen Therapy: Patient Spontanous Breathing  Post-op Assessment: Report given to RN and Post -op Vital signs reviewed and stable  Post vital signs: Reviewed and stable  Last Vitals:  Vitals:   02/26/17 1105 02/26/17 1110  BP: 140/80 (!) 151/75  Pulse:    Resp: 16 16  Temp:    SpO2: 100% 100%    Last Pain:  Vitals:   02/26/17 1040  TempSrc: Oral         Complications: No apparent anesthesia complications

## 2017-02-26 NOTE — Anesthesia Preprocedure Evaluation (Signed)
Anesthesia Evaluation  Patient identified by MRN, date of birth, ID band Patient awake    Airway Mallampati: I  TM Distance: >3 FB     Dental  (+) Teeth Intact   Pulmonary sleep apnea , former smoker,    Pulmonary exam normal        Cardiovascular Exercise Tolerance: Poor hypertension, Pt. on medications and Pt. on home beta blockers + dysrhythmias  Rhythm:Regular Rate:Normal     Neuro/Psych    GI/Hepatic GERD  Medicated,  Endo/Other    Renal/GU      Musculoskeletal   Abdominal (+) + obese,   Peds  Hematology   Anesthesia Other Findings   Reproductive/Obstetrics                             Anesthesia Physical Anesthesia Plan  ASA: III  Anesthesia Plan: MAC   Post-op Pain Management:    Induction:   PONV Risk Score and Plan: Ondansetron and Midazolam  Airway Management Planned: Simple Face Mask  Additional Equipment:   Intra-op Plan:   Post-operative Plan:   Informed Consent: I have reviewed the patients History and Physical, chart, labs and discussed the procedure including the risks, benefits and alternatives for the proposed anesthesia with the patient or authorized representative who has indicated his/her understanding and acceptance.   Dental advisory given  Plan Discussed with:   Anesthesia Plan Comments:         Anesthesia Quick Evaluation

## 2017-02-26 NOTE — Op Note (Signed)
[facility] Patient Name: Rachel Holland Procedure Date: 02/26/2017 11:22 AM MRN: [id number] Date of Birth: 1970/09/05 Attending MD: Norvel Richards , MD CSN: [id number] Age: 47 Admit Type: Outpatient Procedure:                Upper GI endoscopy Indications:              Dysphagia Providers:                Norvel Richards, MD, Jeanann Lewandowsky. Sharon Seller, RN,                            Randa Spike, Technician Referring MD:              Medicines:                Propofol per Anesthesia Complications:            No immediate complications. Estimated Blood Loss:     Estimated blood loss: none. Procedure:                Pre-Anesthesia Assessment:                           - Prior to the procedure, a History and Physical                            was performed, and patient medications and                            allergies were reviewed. The patient's tolerance of                            previous anesthesia was also reviewed. The risks                            and benefits of the procedure and the sedation                            options and risks were discussed with the patient.                            All questions were answered, and informed consent                            was obtained. Prior Anticoagulants: The patient has                            taken no previous anticoagulant or antiplatelet                            agents. ASA Grade Assessment: II - A patient with                            mild systemic disease. After reviewing the risks  and benefits, the patient was deemed in                            satisfactory condition to undergo the procedure.                           After obtaining informed consent, the endoscope was                            passed under direct vision. Throughout the                            procedure, the patient's blood pressure, pulse, and                            oxygen saturations  were monitored continuously. The                            [id number]) was introduced through the and                            advanced to the second part of duodenum. The upper                            GI endoscopy was accomplished without difficulty.                            The patient tolerated the procedure well. Scope In: 11:57:02 AM Scope Out: 12:02:28 PM Total Procedure Duration: 0 hours 5 minutes 26 seconds  Findings:      The examined esophagus was normal.      A small hiatal hernia was present.      The exam was otherwise without abnormality.      The duodenal bulb and second portion of the duodenum were normal. Impression:               - Normal esophagus.                           - Small hiatal hernia.                           - The examination was otherwise normal.                           - Normal duodenal bulb and second portion of the                            duodenum.                           - No specimens collected. Moderate Sedation:      Moderate (conscious) sedation was personally administered by an       anesthesia professional. The following parameters were monitored: oxygen       saturation, heart rate, blood pressure, respiratory rate, EKG, adequacy       of  pulmonary ventilation, and response to care. Total physician       intraservice time was 13 minutes. Recommendation:           - Patient has a contact number available for                            emergencies. The signs and symptoms of potential                            delayed complications were discussed with the                            patient. Return to normal activities tomorrow.                            Written discharge instructions were provided to the                            patient.                           - Resume previous diet.                           - Continue present medications. Increase                            pantoprazole to 40 mg twice daily.                            - No repeat upper endoscopy.                           - Return to GI office in 6 weeks. Procedure Code(s):        --- Professional ---                           [phone], Esophagogastroduodenoscopy, flexible,                            transoral; diagnostic, including collection of                            specimen(s) by brushing or washing, when performed                            (separate procedure) Diagnosis Code(s):        --- Professional ---                           K44.9, Diaphragmatic hernia without obstruction or                            gangrene                           R13.10, Dysphagia, unspecified CPT copyright 2016  American Medical Association. All rights reserved. The codes documented in this report are preliminary and upon coder review may  be revised to meet current compliance requirements. Cristopher Estimable. Maisa Bedingfield, MD Norvel Richards, MD 02/26/2017 12:09:28 PM This report has been signed electronically. Number of Addenda: 0

## 2017-02-26 NOTE — Progress Notes (Signed)
Protonix 40mg  one by mouth twice daily #60, 5 refills called to [facility] in [place] at patient request to Boris Sharper, [place] telephone #[phone].

## 2017-02-26 NOTE — H&P (Signed)
@LOGO @   Primary Care Physician:  Monico Blitz, MD Primary Gastroenterologist:  Dr. Gala Romney  Pre-Procedure History & Physical: HPI:  Rachel Holland is a 47 y.o. female here for further evaluation of vague esophageal dysphagia and epigastric pain. Symptoms of abdominal pain have improved significantly since going on pantoprazole.  Past Medical History:  Diagnosis Date  . Anxiety   . Depression   . Dizziness   . Dysrhythmia   . GERD (gastroesophageal reflux disease)   . Hypertension   . Morbid obesity ([place])   . PVCs (premature ventricular contractions)   . Sleep apnea     Past Surgical History:  Procedure Laterality Date  . arm surgery      Prior to Admission medications   Medication Sig Start Date End Date Taking? Authorizing Provider  clonazePAM (KLONOPIN) 0.5 MG tablet Take 0.5 mg by mouth daily as needed for anxiety.    Yes [provider]  FLUoxetine (PROZAC) 20 MG capsule Take 20 mg by mouth daily.  11/08/16  Yes [provider]  ibuprofen (ADVIL,MOTRIN) 200 MG tablet Take 400 mg by mouth every 6 (six) hours as needed for headache or moderate pain.   Yes [provider]  metoprolol tartrate (LOPRESSOR) 25 MG tablet Take 25 mg by mouth 2 (two) times daily.   Yes [provider]  pantoprazole (PROTONIX) 40 MG tablet Take 1 tablet (40 mg total) by mouth daily. Take 30 minutes prior to breakfast 12/01/16  Yes Annitta Needs, NP    Allergies as of 12/04/2016 - Review Complete 12/01/2016  Allergen Reaction Noted  . Sulfa antibiotics Nausea And Vomiting 02/19/2012    Family History  Problem Relation Age of Onset  . Parkinson's disease Father        and some ectopic beats  . Prostate cancer Father   . Diverticulitis Mother   . Colon cancer Neg Hx     Social History   Socioeconomic History  . Marital status: Married    Spouse name: Not on file  . Number of children: Not on file  . Years of education: Not on file  . Highest  education level: Not on file  Social Needs  . Financial resource strain: Not on file  . Food insecurity - worry: Not on file  . Food insecurity - inability: Not on file  . Transportation needs - medical: Not on file  . Transportation needs - non-medical: Not on file  Occupational History  . Occupation: self-employed    Comment: cleans houses   Tobacco Use  . Smoking status: Former Smoker    Packs/day: 1.00    Years: 20.00    Pack years: 20.00    Types: Cigarettes    Start date: 01/31/1984    Last attempt to quit: 01/31/2008    Years since quitting: 9.0  . Smokeless tobacco: Never Used  Substance and Sexual Activity  . Alcohol use: No  . Drug use: No  . Sexual activity: Yes  Other Topics Concern  . Not on file  Social History Narrative  . Not on file    Review of Systems: See HPI, otherwise negative ROS  Physical Exam: BP (!) 151/75   Pulse 78   Temp 97.8 F (36.6 C) (Oral)   Resp 16   SpO2 100%  General:   Alert,  Well-developed, well-nourished, pleasant and cooperative in NAD Neck:  Supple; no masses or thyromegaly. No significant cervical adenopathy. Lungs:  Clear throughout to auscultation.   No wheezes,  crackles, or rhonchi. No acute distress. Heart:  Regular rate and rhythm; no murmurs, clicks, rubs,  or gallops. Abdomen: Non-distended, normal bowel sounds.  Soft and nontender without appreciable mass or hepatosplenomegaly.  Pulses:  Normal pulses noted. Extremities:  Without clubbing or edema.  Impression: 47 year old lady here with epigastric pain some reflux symptoms of vague esophageal dysphagia. Symptoms improved with pantoprazole and ranitidine. Some dysphagia symptoms persisting.   Recommendations:  I have offered the patient a diagnostic a EGD with possible esophageal dilation today per plan. The risks, benefits, limitations, alternatives and imponderables have been reviewed with the patient. Potential for esophageal dilation, biopsy, etc. have also been  reviewed.  Questions have been answered. All parties agreeable.       Notice: This dictation was prepared with Dragon dictation along with smaller phrase technology. Any transcriptional errors that result from this process are unintentional and may not be corrected upon review.

## 2017-02-27 ENCOUNTER — Telehealth: Payer: Self-pay

## 2017-02-27 ENCOUNTER — Encounter (HOSPITAL_COMMUNITY): Payer: Self-pay | Admitting: Internal Medicine

## 2017-02-27 NOTE — Telephone Encounter (Signed)
Received a PA request from [employer] for Pantoprazole. PA was submitted and approved today. Pt notified. Pharmacy notified.

## 2017-04-09 ENCOUNTER — Ambulatory Visit: Payer: BLUE CROSS/BLUE SHIELD | Admitting: Gastroenterology

## 2017-05-09 ENCOUNTER — Other Ambulatory Visit: Payer: Self-pay | Admitting: Gastroenterology

## 2017-07-31 ENCOUNTER — Ambulatory Visit: Payer: BLUE CROSS/BLUE SHIELD | Admitting: Cardiovascular Disease

## 2017-08-07 ENCOUNTER — Telehealth: Payer: Self-pay | Admitting: *Deleted

## 2017-08-07 ENCOUNTER — Ambulatory Visit: Payer: BLUE CROSS/BLUE SHIELD | Admitting: Neurology

## 2017-08-07 NOTE — Telephone Encounter (Signed)
Canceled new patient appointment same day due to lack of transportation. 

## 2017-08-08 ENCOUNTER — Encounter: Payer: Self-pay | Admitting: Neurology

## 2017-08-16 ENCOUNTER — Other Ambulatory Visit: Payer: Self-pay

## 2017-08-17 MED ORDER — PANTOPRAZOLE SODIUM 40 MG PO TBEC: DELAYED_RELEASE_TABLET | ORAL | 3 refills | Status: DC

## 2017-08-20 LAB — TSH: TSH: 1.3 (ref <=5.90)

## 2017-09-25 ENCOUNTER — Ambulatory Visit: Payer: BLUE CROSS/BLUE SHIELD | Admitting: Cardiovascular Disease

## 2017-09-25 ENCOUNTER — Encounter

## 2017-09-26 ENCOUNTER — Encounter: Payer: Self-pay | Admitting: *Deleted

## 2017-09-26 ENCOUNTER — Telehealth: Payer: Self-pay | Admitting: *Deleted

## 2017-09-26 ENCOUNTER — Encounter: Payer: Self-pay | Admitting: Gastroenterology

## 2017-09-26 ENCOUNTER — Ambulatory Visit (INDEPENDENT_AMBULATORY_CARE_PROVIDER_SITE_OTHER): Payer: BLUE CROSS/BLUE SHIELD | Admitting: Gastroenterology

## 2017-09-26 ENCOUNTER — Other Ambulatory Visit: Payer: Self-pay | Admitting: *Deleted

## 2017-09-26 VITALS — BP 149/88 | HR 76 | Temp 98.2°F | Ht 62.0 in | Wt 298.2 lb

## 2017-09-26 DIAGNOSIS — R198 Other specified symptoms and signs involving the digestive system and abdomen: Secondary | ICD-10-CM

## 2017-09-26 DIAGNOSIS — K5900 Constipation, unspecified: Secondary | ICD-10-CM

## 2017-09-26 DIAGNOSIS — R197 Diarrhea, unspecified: Secondary | ICD-10-CM | POA: Diagnosis not present

## 2017-09-26 DIAGNOSIS — R1013 Epigastric pain: Secondary | ICD-10-CM

## 2017-09-26 DIAGNOSIS — K219 Gastro-esophageal reflux disease without esophagitis: Secondary | ICD-10-CM

## 2017-09-26 MED ORDER — PEG 3350-KCL-NA BICARB-NACL 420 G PO SOLR: 4000.0000 mL | Freq: Once | ORAL | 0 refills | Status: AC

## 2017-09-26 MED ORDER — DICYCLOMINE HCL 10 MG PO CAPS: ORAL_CAPSULE | ORAL | 1 refills | Status: DC

## 2017-09-26 NOTE — Assessment & Plan Note (Signed)
47 y/o female presenting for follow up of change in bowel habits present for four years but progressively worsening. Alternates between diarrhea and then multiple days without BM. Complains of mid to upper abd cramps/pain after meals and followed by urgent bowel movements, loose stools. Pain persists after BM. Complains of nocturnal symptoms as well, wakes up with abd pain and diarrhea hours after last meal of the day. No FH of CRC, IBD, celiac. Mother has similar symptoms for years. I suspect IBS but given change in her symptoms, and no prior colonoscopy she should have further evaluation to exclude other etiologies. Plan for colonoscopy with deep sedation in near future.  I have discussed the risks, alternatives, benefits with regards to but not limited to the risk of reaction to medication, bleeding, infection, perforation and the patient is agreeable to proceed. Written consent to be obtained.  We will try bentyl in the interim, holding for constipation and prior to TCS prep as well. Instructions provided.

## 2017-09-26 NOTE — Assessment & Plan Note (Signed)
Doing better on pantoprazole, taking only once daily. Will continue current dose.

## 2017-09-26 NOTE — Progress Notes (Signed)
CC'ED TO PCP 

## 2017-09-26 NOTE — Progress Notes (Signed)
Primary Care Physician: Monico Blitz, MD  Primary Gastroenterologist:  Garfield Cornea, MD   Chief Complaint  Patient presents with  . Abdominal Pain    mid upper abd  . Constipation  . Diarrhea    usually after eating    HPI: Rachel Holland is a 47 y.o. female here for follow up of GI concerns. H/o upper abd pain, alternating constipation/diarrhea. Had EGD in 01/2017 which was normal aside from small hiatal hernia.   Reflux a lot better on pantoprazole. Never took bid as per post-procedure instructions. Didn't realize she was supposed to. She continues to have bowel concerns. Symptoms ongoing for four years now but continue to worsen. She has postprandial abdominal pain in mid to upper abdomen and fecal urgency/frequency. Has to have multiple BMs after meals. Stools from solid to loose. Never hard. After an episode, then she may go several days without BM. No melena, brbpr. She is afraid to go out due to symptoms. Feels like it messes with her mentally as well. Denies improvement of abd pain with BM. But notes the days she does not have a BM, she has very little abd pain. C/o frequent nocturnal diarrhea. Only food trigger that is consistent is broccoli which causes these symptoms. Last meal 6pm at night. Goes to be at 9-10pm. Will wake up with cramps and diarrhea. No FH of celiac, Crohn's, CRC. Mother has always had similar symptoms.   Current Outpatient Medications  Medication Sig Dispense Refill  . clonazePAM (KLONOPIN) 0.5 MG tablet Take 0.5 mg by mouth daily as needed for anxiety.     [name] FLUoxetine (PROZAC) 20 MG capsule Take 20 mg by mouth daily.     [name] ibuprofen (ADVIL,MOTRIN) 200 MG tablet Take 400 mg by mouth every 6 (six) hours as needed for headache or moderate pain.    . metoprolol tartrate (LOPRESSOR) 25 MG tablet Take 25 mg by mouth 2 (two) times daily.    . pantoprazole (PROTONIX) 40 MG tablet TAKE 1 TABLET BY MOUTH ONCE DAILY TAKE  30  MINUTES  PRIOR  TO  BREAKFAST 30  tablet 3  . dicyclomine (BENTYL) 10 MG capsule Use 1-2 capsules prior to meal and at bedtime (up to four times daily) to prevent abdominal pain and loose stools. Hold for constipation. 120 capsule 1   No current facility-administered medications for this visit.     Allergies as of 09/26/2017 - Review Complete 09/26/2017  Allergen Reaction Noted  . Sulfa antibiotics Nausea And Vomiting 02/19/2012   Past Medical History:  Diagnosis Date  . Anxiety   . Depression   . Dizziness   . Dysrhythmia   . GERD (gastroesophageal reflux disease)   . Hypertension   . Morbid obesity ([place])   . PVCs (premature ventricular contractions)   . Sleep apnea    Past Surgical History:  Procedure Laterality Date  . arm surgery    . ESOPHAGOGASTRODUODENOSCOPY (EGD) WITH PROPOFOL N/A 02/26/2017   Dr. Gala Romney: small hiatal hernia  . MALONEY DILATION N/A 02/26/2017   Procedure: Venia Minks DILATION;  Surgeon: Daneil Dolin, MD;  Location: [facility];  Service: Endoscopy;  Laterality: N/A;   Family History  Problem Relation Age of Onset  . Parkinson's disease Father        and some ectopic beats  . Prostate cancer Father   . Diverticulitis Mother   . Colon cancer Neg Hx   . Inflammatory bowel disease Neg Hx   . Celiac  disease Neg Hx    Social History   Tobacco Use  . Smoking status: Former Smoker    Packs/day: 1.00    Years: 20.00    Pack years: 20.00    Types: Cigarettes    Start date: 01/31/1984    Last attempt to quit: 01/31/2008    Years since quitting: 9.6  . Smokeless tobacco: Never Used  Substance Use Topics  . Alcohol use: No  . Drug use: No    ROS:  General: Negative for anorexia, weight loss, fever, chills, fatigue, weakness. ENT: Negative for hoarseness, difficulty swallowing , nasal congestion. CV: Negative for chest pain, angina, palpitations, dyspnea on exertion, peripheral edema.  Respiratory: Negative for dyspnea at rest, dyspnea on exertion, cough, sputum, wheezing.  GI: See  history of present illness. GU:  Negative for dysuria, hematuria, urinary incontinence, urinary frequency, nocturnal urination.  Endo: Negative for unusual weight change.    Physical Examination:   BP (!) 149/88   Pulse 76   Temp 98.2 F (36.8 C) (Oral)   Ht 5\' 2"  (1.575 m)   Wt 298 lb 3.2 oz (135.3 kg)   LMP 08/26/2017 (Approximate)   BMI 54.54 kg/m   General: Well-nourished, well-developed in no acute distress.  Eyes: No icterus. Mouth: Oropharyngeal mucosa moist and pink , no lesions erythema or exudate. Lungs: Clear to auscultation bilaterally.  Heart: Regular rate and rhythm, no murmurs rubs or gallops.  Abdomen: Bowel sounds are normal, nontender, nondistended, no hepatosplenomegaly or masses, no abdominal bruits or hernia , no rebound or guarding.   Extremities: No lower extremity edema. No clubbing or deformities. Neuro: Alert and oriented x 4   Skin: Warm and dry, no jaundice.   Psych: Alert and cooperative, normal mood and affect.  Labs:  Requested  Imaging Studies: No results found.

## 2017-09-26 NOTE — Patient Instructions (Signed)
1. Try bentyl, 1-2 capsules 30 minutes before a meal and at bedtime to prevent diarrhea and abdominal pain. May take up to four times daily. Hold for constipation.  2. Colonoscopy as scheduled. See separate instructions.  3. I will review copy of labs and ultrasound.

## 2017-09-26 NOTE — Telephone Encounter (Signed)
Called [facility] and spoke with Tonya P and no PA is required for TCS.

## 2017-09-26 NOTE — Telephone Encounter (Signed)
Pre-op scheduled for 10/25/17 at 9:00am. Letter mailed. LMOVM

## 2017-09-28 ENCOUNTER — Telehealth: Payer: Self-pay | Admitting: Gastroenterology

## 2017-09-28 NOTE — Telephone Encounter (Signed)
Reviewed labs and u/s from 10/2016.   The ultrasound was technically difficult due to body habitus.  Fatty liver present.  Gallbladder grossly normal but visualization was quite limited.  No biliary ductal dilation.  Labs dated 08/15/2017  White blood cell count 8800, H/H 12.7/37.8, platelet 291,000, TSH 1.310, glucose 93, BUN 9, creatinine 0.85, total bilirubin 0.3, alkaline phosphatase 65, AST 16, ALT 15, albumin 4.,  Vitamin B12 240  Please let patient know that I did review her ultrasound and labs.  She has fatty liver and normal LFTs.  For now we will continue with plan for colonoscopy and use of dicyclomine for abdominal pain.  If we need to revisit possibility of gallbladder disease in the future would consider other modalities besides ultrasound in this patient.

## 2017-10-01 ENCOUNTER — Other Ambulatory Visit: Payer: Self-pay | Admitting: Gastroenterology

## 2017-10-02 NOTE — Telephone Encounter (Signed)
Pt notified of results

## 2017-10-18 ENCOUNTER — Telehealth: Payer: Self-pay | Admitting: Cardiovascular Disease

## 2017-10-18 NOTE — Telephone Encounter (Signed)
Pt will come tomorrow at [place] office for check and will take extra dose of Metoprolol today

## 2017-10-18 NOTE — Telephone Encounter (Signed)
Calling about having bad episode last night of AFIB

## 2017-10-18 NOTE — Telephone Encounter (Signed)
Can she come in for nursing visit with vitals and EKG. Can take additional 25mg  of lopressor. In general can take lopressor 25mg  bid and additional dose prn palpitations.   Zandra Abts MD

## 2017-10-18 NOTE — Telephone Encounter (Signed)
Pt c/o palpitations/SOB/skipped beats last night after laying down lasting for about 30 mins and could feel her heart beat in her neck and was concerned since this hasn't happened in about 2 years - says this morning she is still having the palpitations feels weak and with headache - denies SOB - says its much milder this morning - denies any chest pain/swelling/weight gain - says she has been taking metoprolol 25 mg bid no new medications - doesn't know what her HR/BP has been running - is scheduled for appt 10/10 - will forward to covering provider

## 2017-10-19 ENCOUNTER — Ambulatory Visit (INDEPENDENT_AMBULATORY_CARE_PROVIDER_SITE_OTHER): Payer: BLUE CROSS/BLUE SHIELD | Admitting: *Deleted

## 2017-10-19 DIAGNOSIS — R002 Palpitations: Secondary | ICD-10-CM

## 2017-10-19 NOTE — Progress Notes (Addendum)
Pt here for EKG and vitals - says she took extra 25 mg of metoprolol and feels better however till feeling some SOB and palpitations  - BP today is 130/94 - EKG done and in Epic - weight was 296lbs

## 2017-10-19 NOTE — Progress Notes (Signed)
Normal EKG and vitals. I would continue her lopressor 25mg  bid, may take additional 25mg  as needed for palpitations. Please get her a f/u with Dr Raliegh Ip within the next few weeks. If her symptoms significantly progress call us back   Zandra Abts MD

## 2017-10-19 NOTE — Progress Notes (Signed)
Pt made aware and voiced understanding.

## 2017-10-22 NOTE — Patient Instructions (Signed)
Rachel Holland  10/22/2017     @PREFPERIOPPHARMACY @   Your procedure is scheduled on  11/01/2017 .  Report to [name] at  745  A.M.  Call this number if you have problems the morning of surgery:  [phone]   Remember:  Do not eat or drink after midnight.  You may drink clear liquids until ( follow the instructions given to you) .  Clear liquids allowed are:                    Water, Juice (non-citric and without pulp), Carbonated beverages, Clear Tea, Black Coffee only, Plain Jell-O only, Gatorade and Plain Popsicles only    Take these medicines the morning of surgery with A SIP OF WATER  Clonazepam, prozac, metoprolol, protonix.    Do not wear jewelry, make-up or nail polish.  Do not wear lotions, powders, or perfumes, or deodorant.  Do not shave 48 hours prior to surgery.  Men may shave face and neck.  Do not bring valuables to the hospital.  [facility] is not responsible for any belongings or valuables.  Contacts, dentures or bridgework may not be worn into surgery.  Leave your suitcase in the car.  After surgery it may be brought to your room.  For patients admitted to the hospital, discharge time will be determined by your treatment team.  Patients discharged the day of surgery will not be allowed to drive home.   Name and phone number of your driver:   family Special instructions:  Follow the diet and prep instructions given to you by Dr Roseanne Kaufman office.  Please read over the following fact sheets that you were given. Anesthesia Post-op Instructions and Care and Recovery After Surgery       Colonoscopy, Adult A colonoscopy is an exam to look at the large intestine. It is done to check for problems, such as:  Lumps (tumors).  Growths (polyps).  Swelling (inflammation).  Bleeding.  What happens before the procedure? Eating and drinking Follow instructions from your doctor about eating and drinking. These instructions may include:  A  few days before the procedure - follow a low-fiber diet. ? Avoid nuts. ? Avoid seeds. ? Avoid dried fruit. ? Avoid raw fruits. ? Avoid vegetables.  1-3 days before the procedure - follow a clear liquid diet. Avoid liquids that have red or purple dye. Drink only clear liquids, such as: ? Clear broth or bouillon. ? Black coffee or tea. ? Clear juice. ? Clear soft drinks or sports drinks. ? Gelatin dessert. ? Popsicles.  On the day of the procedure - do not eat or drink anything during the 2 hours before the procedure.  Bowel prep If you were prescribed an oral bowel prep:  Take it as told by your doctor. Starting the day before your procedure, you will need to drink a lot of liquid. The liquid will cause you to poop (have bowel movements) until your poop is almost clear or light green.  If your skin or butt gets irritated from diarrhea, you may: ? Wipe the area with wipes that have medicine in them, such as adult wet wipes with aloe and vitamin E. ? Put something on your skin that soothes the area, such as petroleum jelly.  If you throw up (vomit) while drinking the bowel prep, take a break for up to 60 minutes. Then begin the bowel prep again. If you keep throwing  up and you cannot take the bowel prep without throwing up, call your doctor.  General instructions  Ask your doctor about changing or stopping your normal medicines. This is important if you take diabetes medicines or blood thinners.  Plan to have someone take you home from the hospital or clinic. What happens during the procedure?  An IV tube may be put into one of your veins.  You will be given medicine to help you relax (sedative).  To reduce your risk of infection: ? Your doctors will wash their hands. ? Your anal area will be washed with soap.  You will be asked to lie on your side with your knees bent.  Your doctor will get a long, thin, flexible tube ready. The tube will have a camera and a light on the  end.  The tube will be put into your anus.  The tube will be gently put into your large intestine.  Air will be delivered into your large intestine to keep it open. You may feel some pressure or cramping.  The camera will be used to take photos.  A small tissue sample may be removed from your body to be looked at under a microscope (biopsy). If any possible problems are found, the tissue will be sent to a lab for testing.  If small growths are found, your doctor may remove them and have them checked for cancer.  The tube that was put into your anus will be slowly removed. The procedure may vary among doctors and hospitals. What happens after the procedure?  Your doctor will check on you often until the medicines you were given have worn off.  Do not drive for 24 hours after the procedure.  You may have a small amount of blood in your poop.  You may pass gas.  You may have mild cramps or bloating in your belly (abdomen).  It is up to you to get the results of your procedure. Ask your doctor, or the department performing the procedure, when your results will be ready. This information is not intended to replace advice given to you by your health care provider. Make sure you discuss any questions you have with your health care provider. Document Released: 02/18/2010 Document Revised: 11/17/2015 Document Reviewed: 03/30/2015 Elsevier Interactive Patient Education  2017 Elsevier Inc.  Colonoscopy, Adult, Care After This sheet gives you information about how to care for yourself after your procedure. Your health care provider may also give you more specific instructions. If you have problems or questions, contact your health care provider. What can I expect after the procedure? After the procedure, it is common to have:  A small amount of blood in your stool for 24 hours after the procedure.  Some gas.  Mild abdominal cramping or bloating.  Follow these instructions at  home: General instructions   For the first 24 hours after the procedure: ? Do not drive or use machinery. ? Do not sign important documents. ? Do not drink alcohol. ? Do your regular daily activities at a slower pace than normal. ? Eat soft, easy-to-digest foods. ? Rest often.  Take over-the-counter or prescription medicines only as told by your health care provider.  It is up to you to get the results of your procedure. Ask your health care provider, or the department performing the procedure, when your results will be ready. Relieving cramping and bloating  Try walking around when you have cramps or feel bloated.  Apply heat to your abdomen  as told by your health care provider. Use a heat source that your health care provider recommends, such as a moist heat pack or a heating pad. ? Place a towel between your skin and the heat source. ? Leave the heat on for 20-30 minutes. ? Remove the heat if your skin turns bright red. This is especially important if you are unable to feel pain, heat, or cold. You may have a greater risk of getting burned. Eating and drinking  Drink enough fluid to keep your urine clear or pale yellow.  Resume your normal diet as instructed by your health care provider. Avoid heavy or fried foods that are hard to digest.  Avoid drinking alcohol for as long as instructed by your health care provider. Contact a health care provider if:  You have blood in your stool 2-3 days after the procedure. Get help right away if:  You have more than a small spotting of blood in your stool.  You pass large blood clots in your stool.  Your abdomen is swollen.  You have nausea or vomiting.  You have a fever.  You have increasing abdominal pain that is not relieved with medicine. This information is not intended to replace advice given to you by your health care provider. Make sure you discuss any questions you have with your health care provider. Document Released:  08/31/2003 Document Revised: 10/11/2015 Document Reviewed: 03/30/2015 Elsevier Interactive Patient Education  2018 [place] Anesthesia is a term that refers to techniques, procedures, and medicines that help a person stay safe and comfortable during a medical procedure. Monitored anesthesia care, or sedation, is one type of anesthesia. Your anesthesia specialist may recommend sedation if you will be having a procedure that does not require you to be unconscious, such as:  Cataract surgery.  A dental procedure.  A biopsy.  A colonoscopy.  During the procedure, you may receive a medicine to help you relax (sedative). There are three levels of sedation:  Mild sedation. At this level, you may feel awake and relaxed. You will be able to follow directions.  Moderate sedation. At this level, you will be sleepy. You may not remember the procedure.  Deep sedation. At this level, you will be asleep. You will not remember the procedure.  The more medicine you are given, the deeper your level of sedation will be. Depending on how you respond to the procedure, the anesthesia specialist may change your level of sedation or the type of anesthesia to fit your needs. An anesthesia specialist will monitor you closely during the procedure. Let your health care provider know about:  Any allergies you have.  All medicines you are taking, including vitamins, herbs, eye drops, creams, and over-the-counter medicines.  Any use of steroids (by mouth or as a cream).  Any problems you or family members have had with sedatives and anesthetic medicines.  Any blood disorders you have.  Any surgeries you have had.  Any medical conditions you have, such as sleep apnea.  Whether you are pregnant or may be pregnant.  Any use of cigarettes, alcohol, or street drugs. What are the risks? Generally, this is a safe procedure. However, problems may occur, including:  Getting too  much medicine (oversedation).  Nausea.  Allergic reaction to medicines.  Trouble breathing. If this happens, a breathing tube may be used to help with breathing. It will be removed when you are awake and breathing on your own.  Heart trouble.  Lung  trouble.  Before the procedure Staying hydrated Follow instructions from your health care provider about hydration, which may include:  Up to 2 hours before the procedure - you may continue to drink clear liquids, such as water, clear fruit juice, black coffee, and plain tea.  Eating and drinking restrictions Follow instructions from your health care provider about eating and drinking, which may include:  8 hours before the procedure - stop eating heavy meals or foods such as meat, fried foods, or fatty foods.  6 hours before the procedure - stop eating light meals or foods, such as toast or cereal.  6 hours before the procedure - stop drinking milk or drinks that contain milk.  2 hours before the procedure - stop drinking clear liquids.  Medicines Ask your health care provider about:  Changing or stopping your regular medicines. This is especially important if you are taking diabetes medicines or blood thinners.  Taking medicines such as aspirin and ibuprofen. These medicines can thin your blood. Do not take these medicines before your procedure if your health care provider instructs you not to.  Tests and exams  You will have a physical exam.  You may have blood tests done to show: ? How well your kidneys and liver are working. ? How well your blood can clot.  General instructions  Plan to have someone take you home from the hospital or clinic.  If you will be going home right after the procedure, plan to have someone with you for 24 hours.  What happens during the procedure?  Your blood pressure, heart rate, breathing, level of pain and overall condition will be monitored.  An IV tube will be inserted into one of  your veins.  Your anesthesia specialist will give you medicines as needed to keep you comfortable during the procedure. This may mean changing the level of sedation.  The procedure will be performed. After the procedure  Your blood pressure, heart rate, breathing rate, and blood oxygen level will be monitored until the medicines you were given have worn off.  Do not drive for 24 hours if you received a sedative.  You may: ? Feel sleepy, clumsy, or nauseous. ? Feel forgetful about what happened after the procedure. ? Have a sore throat if you had a breathing tube during the procedure. ? Vomit. This information is not intended to replace advice given to you by your health care provider. Make sure you discuss any questions you have with your health care provider. Document Released: 10/12/2004 Document Revised: 06/25/2015 Document Reviewed: 05/09/2015 Elsevier Interactive Patient Education  2018 [place], Care After These instructions provide you with information about caring for yourself after your procedure. Your health care provider may also give you more specific instructions. Your treatment has been planned according to current medical practices, but problems sometimes occur. Call your health care provider if you have any problems or questions after your procedure. What can I expect after the procedure? After your procedure, it is common to:  Feel sleepy for several hours.  Feel clumsy and have poor balance for several hours.  Feel forgetful about what happened after the procedure.  Have poor judgment for several hours.  Feel nauseous or vomit.  Have a sore throat if you had a breathing tube during the procedure.  Follow these instructions at home: For at least 24 hours after the procedure:   Do not: ? Participate in activities in which you could fall or become injured. ?  Drive. ? Use heavy machinery. ? Drink alcohol. ? Take sleeping pills  or medicines that cause drowsiness. ? Make important decisions or sign legal documents. ? Take care of children on your own.  Rest. Eating and drinking  Follow the diet that is recommended by your health care provider.  If you vomit, drink water, juice, or soup when you can drink without vomiting.  Make sure you have little or no nausea before eating solid foods. General instructions  Have a responsible adult stay with you until you are awake and alert.  Take over-the-counter and prescription medicines only as told by your health care provider.  If you smoke, do not smoke without supervision.  Keep all follow-up visits as told by your health care provider. This is important. Contact a health care provider if:  You keep feeling nauseous or you keep vomiting.  You feel light-headed.  You develop a rash.  You have a fever. Get help right away if:  You have trouble breathing. This information is not intended to replace advice given to you by your health care provider. Make sure you discuss any questions you have with your health care provider. Document Released: 05/09/2015 Document Revised: 09/08/2015 Document Reviewed: 05/09/2015 Elsevier Interactive Patient Education  [employer].

## 2017-10-25 ENCOUNTER — Encounter (HOSPITAL_COMMUNITY)
Admission: RE | Admit: 2017-10-25 | Discharge: 2017-10-25 | Disposition: A | Discharge status: Home / Self Care | Payer: BLUE CROSS/BLUE SHIELD | Source: Ambulatory Visit | Attending: Internal Medicine | Admitting: Internal Medicine

## 2017-10-25 ENCOUNTER — Encounter (HOSPITAL_COMMUNITY): Payer: Self-pay

## 2017-10-25 ENCOUNTER — Telehealth: Payer: Self-pay | Admitting: *Deleted

## 2017-10-25 NOTE — Telephone Encounter (Signed)
Patient called needing to r/s her TCS scheduled for 11/01/17 with RMR. Spouse unable to get off work. Patient now scheduled for 11/22/17 at 1:15pm. Pre-op scheduled for 11/16/17 at 9:00am. New instructions mailed. Patient aware of pre-op appt. Letter mailed.

## 2017-11-08 ENCOUNTER — Encounter: Payer: Self-pay | Admitting: Cardiovascular Disease

## 2017-11-08 ENCOUNTER — Ambulatory Visit (INDEPENDENT_AMBULATORY_CARE_PROVIDER_SITE_OTHER): Payer: BLUE CROSS/BLUE SHIELD | Admitting: Cardiovascular Disease

## 2017-11-08 VITALS — BP 128/74 | HR 76 | Ht 62.0 in | Wt 299.0 lb

## 2017-11-08 DIAGNOSIS — R002 Palpitations: Secondary | ICD-10-CM

## 2017-11-08 DIAGNOSIS — I493 Ventricular premature depolarization: Secondary | ICD-10-CM

## 2017-11-08 NOTE — Patient Instructions (Signed)
Medication Instructions:  Continue all current medications.  Labwork: none  Testing/Procedures:  Your physician has recommended that you wear a 7 day event monitor. Event monitors are medical devices that record the heart's electrical activity. Doctors most often [place] these monitors to diagnose arrhythmias. Arrhythmias are problems with the speed or rhythm of the heartbeat. The monitor is a small, portable device. You can wear one while you do your normal daily activities. This is usually used to diagnose what is causing palpitations/syncope (passing out).  Your physician has requested that you have an echocardiogram. Echocardiography is a painless test that uses sound waves to create images of your heart. It provides your doctor with information about the size and shape of your heart and how well your heart's chambers and valves are working. This procedure takes approximately one hour. There are no restrictions for this procedure.  Office will contact with results via phone or letter.    Follow-Up: 3-4 months   Any Other Special Instructions Will Be Listed Below (If Applicable).  If you need a refill on your cardiac medications before your next appointment, please call your pharmacy.

## 2017-11-08 NOTE — Progress Notes (Signed)
SUBJECTIVE: The patient presents for overdue follow-up of symptomatic PVCs/palpitations.  She had an unremarkable EKG and vital signs on 10/19/2017.  She was instructed to take an extra 25 mg of metoprolol as needed for palpitations.  She currently takes Lopressor 25 mg twice daily.  I last evaluated her in September 2017.  I ordered a nuclear stress test 2 years ago.  It appears she had it done at [facility] and I will have to request the results.  She was told it was normal by her PCP.  Over the past 2 months she has had increasing palpitations primarily at night.  She denies exertional chest pain.  She has chronic exertional dyspnea which has been stable over the last several years which she attributes to morbid obesity.  About 6 months ago she lost 50 pounds and felt less short of breath and felt much better but put it back on over the last several months.  She has chronic lightheadedness and dizziness which is been going on for the past 5 years.  She has been evaluated by ENT and is currently seeing a neurologist.    Review of Systems: As per "subjective", otherwise negative.  Allergies  Allergen Reactions  . Sulfa Antibiotics Nausea And Vomiting    Current Outpatient Medications  Medication Sig Dispense Refill  . clonazePAM (KLONOPIN) 0.5 MG tablet Take 0.5 mg by mouth daily as needed for anxiety.     [name] FLUoxetine (PROZAC) 20 MG capsule Take 20 mg by mouth daily.     [name] ibuprofen (ADVIL,MOTRIN) 200 MG tablet Take 400 mg by mouth every 6 (six) hours as needed for headache or moderate pain.    . metoprolol tartrate (LOPRESSOR) 25 MG tablet Take 25 mg by mouth 2 (two) times daily.    . pantoprazole (PROTONIX) 40 MG tablet TAKE 1 TABLET BY MOUTH ONCE DAILY 30 MINUTES BEFORE BREAKFAST 90 tablet 1   No current facility-administered medications for this visit.     Past Medical History:  Diagnosis Date  . Anxiety   . Depression   . Dizziness   . Dysrhythmia   . GERD  (gastroesophageal reflux disease)   . Hypertension   . Morbid obesity ([place])   . PVCs (premature ventricular contractions)   . Sleep apnea     Past Surgical History:  Procedure Laterality Date  . arm surgery    . ESOPHAGOGASTRODUODENOSCOPY (EGD) WITH PROPOFOL N/A 02/26/2017   Dr. Gala Romney: small hiatal hernia  . MALONEY DILATION N/A 02/26/2017   Procedure: Venia Minks DILATION;  Surgeon: Daneil Dolin, MD;  Location: [facility] SUITE;  Service: Endoscopy;  Laterality: N/A;    Social History   Socioeconomic History  . Marital status: Married    Spouse name: Not on file  . Number of children: Not on file  . Years of education: Not on file  . Highest education level: Not on file  Occupational History  . 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 Financial resource strain: Not on file  . Food insecurity:    Worry: Not on file    Inability: Not on file  . Transportation needs:    Medical: Not on file    Non-medical: Not on file  Tobacco Use  . Smoking status: Former Smoker    Packs/day: 1.00    Years: 20.00    Pack years: 20.00    Types: Cigarettes    Start date: 01/31/1984  Last attempt to quit: 01/31/2008    Years since quitting: 9.7  . Smokeless tobacco: Never Used  Substance and Sexual Activity  . Alcohol use: No  . Drug use: No  . Sexual activity: Yes  Lifestyle  . Physical activity:    Days per week: Not on file    Minutes per session: Not on file  . Stress: Not on file  Relationships  . Social connections:    Talks on phone: Not on file    Gets together: Not on file    Attends religious service: Not on file    Active member of club or organization: Not on file    Attends meetings of clubs or organizations: Not on file    Relationship status: Not on file  . Intimate partner violence:    Fear of current or ex partner: Not on file    Emotionally abused: Not on file    Physically abused: Not on file    Forced sexual activity: Not on file    Other Topics Concern  . Not on file  Social History Narrative  . Not on file     Vitals:   11/08/17 1401  BP: 128/74  Pulse: 76  SpO2: 94%  Weight: 299 lb (135.6 kg)  Height: 5\' 2"  (1.575 m)    Wt Readings from Last 3 Encounters:  11/08/17 299 lb (135.6 kg)  09/26/17 298 lb 3.2 oz (135.3 kg)  02/20/17 276 lb (125.2 kg)     PHYSICAL EXAM General: NAD HEENT: Normal. Neck: No JVD, no thyromegaly. Lungs: Clear to auscultation bilaterally with normal respiratory effort. CV: Regular rate and rhythm, normal S1/S2, no S3/S4, no murmur. No pretibial or periankle edema.  No carotid bruit.   Abdomen: Soft, nontender, morbidly obese.  Neurologic: Alert and oriented.  Psych: Normal affect. Skin: Normal. Musculoskeletal: No gross deformities.    ECG: Reviewed above under Subjective   Labs: Lab Results  Component Value Date/Time   K 4.2 02/20/2017 10:11 AM   BUN 14 02/20/2017 10:11 AM   CREATININE 0.79 02/20/2017 10:11 AM   HGB 13.1 02/20/2017 10:11 AM     Lipids: No results found for: LDLCALC, LDLDIRECT, CHOL, TRIG, HDL     ASSESSMENT AND PLAN: 1.  Palpitations/symptomatic PVCs: Currently on Lopressor 25 mg twice daily and has been instructed to take an extra 25 mg for worsening palpitations.  I will order a 1 week event monitor and I have asked her to try to avoid taking an extra 25 mg of metoprolol during this period. I will order a 2-D echocardiogram with Doppler to evaluate cardiac structure, function, and regional wall motion.  2.  Morbid obesity: She lost 50 pounds 6 months ago but has put it back on.    Disposition: Follow up 3 months   Kate Sable, M.D., F.A.C.C.

## 2017-11-08 NOTE — Addendum Note (Signed)
Addended by: Laurine Blazer on: 11/08/2017 02:45 PM   Modules accepted: Orders

## 2017-11-09 ENCOUNTER — Telehealth: Payer: Self-pay | Admitting: Cardiovascular Disease

## 2017-11-09 NOTE — Telephone Encounter (Signed)
Pt says she was cleaning house at Dr Johnnye Sima house and c/o palpitations - says Dr Glo Herring listened to her and says she was having 15 PVC's per minute according to him and suggested that pt call us - pt denies any SOB/dizziness (very anxious and has taken klonopin this morning) took 1 25 mg dose of Lopressor HR 90s - says she will take an additional 25 mg of Lopressor to see if this helps and wanted to know if she should continue to take the extra Lopressor as needed for palpitations until she receives monitor

## 2017-11-09 NOTE — Telephone Encounter (Signed)
LM to return call.

## 2017-11-09 NOTE — Telephone Encounter (Signed)
Patient called stating that she is cleaning a house this morning and she has started having palpitations.

## 2017-11-09 NOTE — Telephone Encounter (Signed)
Pt made aware and will continue to monitor symptoms and f/u with monitor

## 2017-11-09 NOTE — Telephone Encounter (Signed)
Yes, okay to take an additional 25 mg of Lopressor until she receives the monitor.

## 2017-11-13 NOTE — Patient Instructions (Signed)
Rachel Holland  11/13/2017     @PREFPERIOPPHARMACY @   Your procedure is scheduled on  11/22/2017  Report to [name] at  1130  A.M.  Call this number if you have problems the morning of surgery:  [phone]   Remember: Follow the diet and prep instructions given to you by Dr Roseanne Kaufman office.                                   Take these medicines the morning of surgery with A SIP OF WATER  Clonazepam, metoprolol, protonix, zoloft.    Do not wear jewelry, make-up or nail polish.  Do not wear lotions, powders, or perfumes, or deodorant.  Do not shave 48 hours prior to surgery.  Men may shave face and neck.  Do not bring valuables to the hospital.  [facility] is not responsible for any belongings or valuables.  Contacts, dentures or bridgework may not be worn into surgery.  Leave your suitcase in the car.  After surgery it may be brought to your room.  For patients admitted to the hospital, discharge time will be determined by your treatment team.  Patients discharged the day of surgery will not be allowed to drive home.   Name and phone number of your driver:   family Special instructions:  None  Please read over the following fact sheets that you were given. Anesthesia Post-op Instructions and Care and Recovery After Surgery       Colonoscopy, Adult A colonoscopy is an exam to look at the large intestine. It is done to check for problems, such as:  Lumps (tumors).  Growths (polyps).  Swelling (inflammation).  Bleeding.  What happens before the procedure? Eating and drinking Follow instructions from your doctor about eating and drinking. These instructions may include:  A few days before the procedure - follow a low-fiber diet. ? Avoid nuts. ? Avoid seeds. ? Avoid dried fruit. ? Avoid raw fruits. ? Avoid vegetables.  1-3 days before the procedure - follow a clear liquid diet. Avoid liquids that have red or purple dye. Drink only  clear liquids, such as: ? Clear broth or bouillon. ? Black coffee or tea. ? Clear juice. ? Clear soft drinks or sports drinks. ? Gelatin dessert. ? Popsicles.  On the day of the procedure - do not eat or drink anything during the 2 hours before the procedure.  Bowel prep If you were prescribed an oral bowel prep:  Take it as told by your doctor. Starting the day before your procedure, you will need to drink a lot of liquid. The liquid will cause you to poop (have bowel movements) until your poop is almost clear or light green.  If your skin or butt gets irritated from diarrhea, you may: ? Wipe the area with wipes that have medicine in them, such as adult wet wipes with aloe and vitamin E. ? Put something on your skin that soothes the area, such as petroleum jelly.  If you throw up (vomit) while drinking the bowel prep, take a break for up to 60 minutes. Then begin the bowel prep again. If you keep throwing up and you cannot take the bowel prep without throwing up, call your doctor.  General instructions  Ask your doctor about changing or stopping your normal medicines. This is important if you take diabetes medicines or  blood thinners.  Plan to have someone take you home from the hospital or clinic. What happens during the procedure?  An IV tube may be put into one of your veins.  You will be given medicine to help you relax (sedative).  To reduce your risk of infection: ? Your doctors will wash their hands. ? Your anal area will be washed with soap.  You will be asked to lie on your side with your knees bent.  Your doctor will get a long, thin, flexible tube ready. The tube will have a camera and a light on the end.  The tube will be put into your anus.  The tube will be gently put into your large intestine.  Air will be delivered into your large intestine to keep it open. You may feel some pressure or cramping.  The camera will be used to take photos.  A small tissue  sample may be removed from your body to be looked at under a microscope (biopsy). If any possible problems are found, the tissue will be sent to a lab for testing.  If small growths are found, your doctor may remove them and have them checked for cancer.  The tube that was put into your anus will be slowly removed. The procedure may vary among doctors and hospitals. What happens after the procedure?  Your doctor will check on you often until the medicines you were given have worn off.  Do not drive for 24 hours after the procedure.  You may have a small amount of blood in your poop.  You may pass gas.  You may have mild cramps or bloating in your belly (abdomen).  It is up to you to get the results of your procedure. Ask your doctor, or the department performing the procedure, when your results will be ready. This information is not intended to replace advice given to you by your health care provider. Make sure you discuss any questions you have with your health care provider. Document Released: 02/18/2010 Document Revised: 11/17/2015 Document Reviewed: 03/30/2015 Elsevier Interactive Patient Education  2017 [employer]  Colonoscopy, Adult, Care After This sheet gives you information about how to care for yourself after your procedure. Your health care provider may also give you more specific instructions. If you have problems or questions, contact your health care provider. What can I expect after the procedure? After the procedure, it is common to have:  A small amount of blood in your stool for 24 hours after the procedure.  Some gas.  Mild abdominal cramping or bloating.  Follow these instructions at home: General instructions   For the first 24 hours after the procedure: ? Do not drive or use machinery. ? Do not sign important documents. ? Do not drink alcohol. ? Do your regular daily activities at a slower pace than normal. ? Eat soft, easy-to-digest foods. ? Rest  often.  Take over-the-counter or prescription medicines only as told by your health care provider.  It is up to you to get the results of your procedure. Ask your health care provider, or the department performing the procedure, when your results will be ready. Relieving cramping and bloating  Try walking around when you have cramps or feel bloated.  Apply heat to your abdomen as told by your health care provider. Use a heat source that your health care provider recommends, such as a moist heat pack or a heating pad. ? Place a towel between your skin and the heat  source. ? Leave the heat on for 20-30 minutes. ? Remove the heat if your skin turns bright red. This is especially important if you are unable to feel pain, heat, or cold. You may have a greater risk of getting burned. Eating and drinking  Drink enough fluid to keep your urine clear or pale yellow.  Resume your normal diet as instructed by your health care provider. Avoid heavy or fried foods that are hard to digest.  Avoid drinking alcohol for as long as instructed by your health care provider. Contact a health care provider if:  You have blood in your stool 2-3 days after the procedure. Get help right away if:  You have more than a small spotting of blood in your stool.  You pass large blood clots in your stool.  Your abdomen is swollen.  You have nausea or vomiting.  You have a fever.  You have increasing abdominal pain that is not relieved with medicine. This information is not intended to replace advice given to you by your health care provider. Make sure you discuss any questions you have with your health care provider. Document Released: 08/31/2003 Document Revised: 10/11/2015 Document Reviewed: 03/30/2015 Elsevier Interactive Patient Education  2018 [place] Anesthesia is a term that refers to techniques, procedures, and medicines that help a person stay safe and comfortable  during a medical procedure. Monitored anesthesia care, or sedation, is one type of anesthesia. Your anesthesia specialist may recommend sedation if you will be having a procedure that does not require you to be unconscious, such as:  Cataract surgery.  A dental procedure.  A biopsy.  A colonoscopy.  During the procedure, you may receive a medicine to help you relax (sedative). There are three levels of sedation:  Mild sedation. At this level, you may feel awake and relaxed. You will be able to follow directions.  Moderate sedation. At this level, you will be sleepy. You may not remember the procedure.  Deep sedation. At this level, you will be asleep. You will not remember the procedure.  The more medicine you are given, the deeper your level of sedation will be. Depending on how you respond to the procedure, the anesthesia specialist may change your level of sedation or the type of anesthesia to fit your needs. An anesthesia specialist will monitor you closely during the procedure. Let your health care provider know about:  Any allergies you have.  All medicines you are taking, including vitamins, herbs, eye drops, creams, and over-the-counter medicines.  Any use of steroids (by mouth or as a cream).  Any problems you or family members have had with sedatives and anesthetic medicines.  Any blood disorders you have.  Any surgeries you have had.  Any medical conditions you have, such as sleep apnea.  Whether you are pregnant or may be pregnant.  Any use of cigarettes, alcohol, or street drugs. What are the risks? Generally, this is a safe procedure. However, problems may occur, including:  Getting too much medicine (oversedation).  Nausea.  Allergic reaction to medicines.  Trouble breathing. If this happens, a breathing tube may be used to help with breathing. It will be removed when you are awake and breathing on your own.  Heart trouble.  Lung trouble.  Before  the procedure Staying hydrated Follow instructions from your health care provider about hydration, which may include:  Up to 2 hours before the procedure - you may continue to drink clear liquids, such as  water, clear fruit juice, black coffee, and plain tea.  Eating and drinking restrictions Follow instructions from your health care provider about eating and drinking, which may include:  8 hours before the procedure - stop eating heavy meals or foods such as meat, fried foods, or fatty foods.  6 hours before the procedure - stop eating light meals or foods, such as toast or cereal.  6 hours before the procedure - stop drinking milk or drinks that contain milk.  2 hours before the procedure - stop drinking clear liquids.  Medicines Ask your health care provider about:  Changing or stopping your regular medicines. This is especially important if you are taking diabetes medicines or blood thinners.  Taking medicines such as aspirin and ibuprofen. These medicines can thin your blood. Do not take these medicines before your procedure if your health care provider instructs you not to.  Tests and exams  You will have a physical exam.  You may have blood tests done to show: ? How well your kidneys and liver are working. ? How well your blood can clot.  General instructions  Plan to have someone take you home from the hospital or clinic.  If you will be going home right after the procedure, plan to have someone with you for 24 hours.  What happens during the procedure?  Your blood pressure, heart rate, breathing, level of pain and overall condition will be monitored.  An IV tube will be inserted into one of your veins.  Your anesthesia specialist will give you medicines as needed to keep you comfortable during the procedure. This may mean changing the level of sedation.  The procedure will be performed. After the procedure  Your blood pressure, heart rate, breathing rate, and  blood oxygen level will be monitored until the medicines you were given have worn off.  Do not drive for 24 hours if you received a sedative.  You may: ? Feel sleepy, clumsy, or nauseous. ? Feel forgetful about what happened after the procedure. ? Have a sore throat if you had a breathing tube during the procedure. ? Vomit. This information is not intended to replace advice given to you by your health care provider. Make sure you discuss any questions you have with your health care provider. Document Released: 10/12/2004 Document Revised: 06/25/2015 Document Reviewed: 05/09/2015 Elsevier Interactive Patient Education  2018 [place], Care After These instructions provide you with information about caring for yourself after your procedure. Your health care provider may also give you more specific instructions. Your treatment has been planned according to current medical practices, but problems sometimes occur. Call your health care provider if you have any problems or questions after your procedure. What can I expect after the procedure? After your procedure, it is common to:  Feel sleepy for several hours.  Feel clumsy and have poor balance for several hours.  Feel forgetful about what happened after the procedure.  Have poor judgment for several hours.  Feel nauseous or vomit.  Have a sore throat if you had a breathing tube during the procedure.  Follow these instructions at home: For at least 24 hours after the procedure:   Do not: ? Participate in activities in which you could fall or become injured. ? Drive. ? Use heavy machinery. ? Drink alcohol. ? Take sleeping pills or medicines that cause drowsiness. ? Make important decisions or sign legal documents. ? Take care of children on your own.  Rest. Eating and  drinking  Follow the diet that is recommended by your health care provider.  If you vomit, drink water, juice, or soup when you  can drink without vomiting.  Make sure you have little or no nausea before eating solid foods. General instructions  Have a responsible adult stay with you until you are awake and alert.  Take over-the-counter and prescription medicines only as told by your health care provider.  If you smoke, do not smoke without supervision.  Keep all follow-up visits as told by your health care provider. This is important. Contact a health care provider if:  You keep feeling nauseous or you keep vomiting.  You feel light-headed.  You develop a rash.  You have a fever. Get help right away if:  You have trouble breathing. This information is not intended to replace advice given to you by your health care provider. Make sure you discuss any questions you have with your health care provider. Document Released: 05/09/2015 Document Revised: 09/08/2015 Document Reviewed: 05/09/2015 Elsevier Interactive Patient Education  [employer].

## 2017-11-14 ENCOUNTER — Encounter: Payer: Self-pay | Admitting: *Deleted

## 2017-11-14 ENCOUNTER — Other Ambulatory Visit: Payer: Self-pay | Admitting: Cardiovascular Disease

## 2017-11-14 DIAGNOSIS — I493 Ventricular premature depolarization: Secondary | ICD-10-CM

## 2017-11-14 DIAGNOSIS — R0609 Other forms of dyspnea: Secondary | ICD-10-CM

## 2017-11-14 DIAGNOSIS — R0600 Dyspnea, unspecified: Secondary | ICD-10-CM

## 2017-11-16 ENCOUNTER — Ambulatory Visit (INDEPENDENT_AMBULATORY_CARE_PROVIDER_SITE_OTHER): Payer: BLUE CROSS/BLUE SHIELD

## 2017-11-16 ENCOUNTER — Inpatient Hospital Stay (HOSPITAL_COMMUNITY): Admission: RE | Admit: 2017-11-16 | Payer: BLUE CROSS/BLUE SHIELD | Source: Ambulatory Visit

## 2017-11-16 DIAGNOSIS — R002 Palpitations: Secondary | ICD-10-CM

## 2017-11-19 ENCOUNTER — Encounter (HOSPITAL_COMMUNITY)
Admission: RE | Admit: 2017-11-19 | Discharge: 2017-11-19 | Disposition: A | Discharge status: Home / Self Care | Payer: BLUE CROSS/BLUE SHIELD | Source: Ambulatory Visit | Attending: Internal Medicine | Admitting: Internal Medicine

## 2017-11-19 ENCOUNTER — Telehealth: Payer: Self-pay | Admitting: Internal Medicine

## 2017-11-19 NOTE — Telephone Encounter (Signed)
Pre-op scheduled for 12/05/17 at 8:00am. Patient aware. Letter mailed.

## 2017-11-19 NOTE — Telephone Encounter (Signed)
Spoke with patient and she is scheduled for 12/10/17 at 12:00pm. New instructions have been mailed to patient. Will call back with pre-op. She already has prep at home.

## 2017-11-19 NOTE — Telephone Encounter (Signed)
Patient called to say that her mother in law died last night and she is scheduled her colonoscopy this Thursday with [facility]. She is needing to reschedule. Please call her back at [phone]

## 2017-11-28 ENCOUNTER — Other Ambulatory Visit: Payer: Self-pay

## 2017-11-28 ENCOUNTER — Ambulatory Visit (INDEPENDENT_AMBULATORY_CARE_PROVIDER_SITE_OTHER): Payer: BLUE CROSS/BLUE SHIELD

## 2017-11-28 DIAGNOSIS — R0609 Other forms of dyspnea: Secondary | ICD-10-CM | POA: Diagnosis not present

## 2017-11-28 DIAGNOSIS — I493 Ventricular premature depolarization: Secondary | ICD-10-CM | POA: Diagnosis not present

## 2017-11-28 DIAGNOSIS — R0600 Dyspnea, unspecified: Secondary | ICD-10-CM

## 2017-11-28 NOTE — Patient Instructions (Signed)
Rachel Holland  11/28/2017     @PREFPERIOPPHARMACY @   Your procedure is scheduled on  12/10/2017 .  Report to [facility] at  1030   A.M.  Call this number if you have problems the morning of surgery:  [phone]   Remember:  Follow the diet and prep instructions given to you by Dr Roseanne Kaufman office.                 Take these medicines the morning of surgery with A SIP OF WATER  Clonazepam(if needed), metoprolol, protonix, zoloft.    Do not wear jewelry, make-up or nail polish.  Do not wear lotions, powders, or perfumes, or deodorant.  Do not shave 48 hours prior to surgery.  Men may shave face and neck.  Do not bring valuables to the hospital.  [facility] is not responsible for any belongings or valuables.  Contacts, dentures or bridgework may not be worn into surgery.  Leave your suitcase in the car.  After surgery it may be brought to your room.  For patients admitted to the hospital, discharge time will be determined by your treatment team.  Patients discharged the day of surgery will not be allowed to drive home.   Name and phone number of your driver:   family Special instructions:  None  Please read over the following fact sheets that you were given. Anesthesia Post-op Instructions and Care and Recovery After Surgery       Colonoscopy, Adult A colonoscopy is an exam to look at the large intestine. It is done to check for problems, such as:  Lumps (tumors).  Growths (polyps).  Swelling (inflammation).  Bleeding.  What happens before the procedure? Eating and drinking Follow instructions from your doctor about eating and drinking. These instructions may include:  A few days before the procedure - follow a low-fiber diet. ? Avoid nuts. ? Avoid seeds. ? Avoid dried fruit. ? Avoid raw fruits. ? Avoid vegetables.  1-3 days before the procedure - follow a clear liquid diet. Avoid liquids that have red or purple dye. Drink only clear  liquids, such as: ? Clear broth or bouillon. ? Black coffee or tea. ? Clear juice. ? Clear soft drinks or sports drinks. ? Gelatin dessert. ? Popsicles.  On the day of the procedure - do not eat or drink anything during the 2 hours before the procedure.  Bowel prep If you were prescribed an oral bowel prep:  Take it as told by your doctor. Starting the day before your procedure, you will need to drink a lot of liquid. The liquid will cause you to poop (have bowel movements) until your poop is almost clear or light green.  If your skin or butt gets irritated from diarrhea, you may: ? Wipe the area with wipes that have medicine in them, such as adult wet wipes with aloe and vitamin E. ? Put something on your skin that soothes the area, such as petroleum jelly.  If you throw up (vomit) while drinking the bowel prep, take a break for up to 60 minutes. Then begin the bowel prep again. If you keep throwing up and you cannot take the bowel prep without throwing up, call your doctor.  General instructions  Ask your doctor about changing or stopping your normal medicines. This is important if you take diabetes medicines or blood thinners.  Plan to have someone take you home from the hospital or  clinic. What happens during the procedure?  An IV tube may be put into one of your veins.  You will be given medicine to help you relax (sedative).  To reduce your risk of infection: ? Your doctors will wash their hands. ? Your anal area will be washed with soap.  You will be asked to lie on your side with your knees bent.  Your doctor will get a long, thin, flexible tube ready. The tube will have a camera and a light on the end.  The tube will be put into your anus.  The tube will be gently put into your large intestine.  Air will be delivered into your large intestine to keep it open. You may feel some pressure or cramping.  The camera will be used to take photos.  A small tissue  sample may be removed from your body to be looked at under a microscope (biopsy). If any possible problems are found, the tissue will be sent to a lab for testing.  If small growths are found, your doctor may remove them and have them checked for cancer.  The tube that was put into your anus will be slowly removed. The procedure may vary among doctors and hospitals. What happens after the procedure?  Your doctor will check on you often until the medicines you were given have worn off.  Do not drive for 24 hours after the procedure.  You may have a small amount of blood in your poop.  You may pass gas.  You may have mild cramps or bloating in your belly (abdomen).  It is up to you to get the results of your procedure. Ask your doctor, or the department performing the procedure, when your results will be ready. This information is not intended to replace advice given to you by your health care provider. Make sure you discuss any questions you have with your health care provider. Document Released: 02/18/2010 Document Revised: 11/17/2015 Document Reviewed: 03/30/2015 Elsevier Interactive Patient Education  2017 [employer]  Colonoscopy, Adult, Care After This sheet gives you information about how to care for yourself after your procedure. Your health care provider may also give you more specific instructions. If you have problems or questions, contact your health care provider. What can I expect after the procedure? After the procedure, it is common to have:  A small amount of blood in your stool for 24 hours after the procedure.  Some gas.  Mild abdominal cramping or bloating.  Follow these instructions at home: General instructions   For the first 24 hours after the procedure: ? Do not drive or use machinery. ? Do not sign important documents. ? Do not drink alcohol. ? Do your regular daily activities at a slower pace than normal. ? Eat soft, easy-to-digest foods. ? Rest  often.  Take over-the-counter or prescription medicines only as told by your health care provider.  It is up to you to get the results of your procedure. Ask your health care provider, or the department performing the procedure, when your results will be ready. Relieving cramping and bloating  Try walking around when you have cramps or feel bloated.  Apply heat to your abdomen as told by your health care provider. Use a heat source that your health care provider recommends, such as a moist heat pack or a heating pad. ? Place a towel between your skin and the heat source. ? Leave the heat on for 20-30 minutes. ? Remove the heat if  your skin turns bright red. This is especially important if you are unable to feel pain, heat, or cold. You may have a greater risk of getting burned. Eating and drinking  Drink enough fluid to keep your urine clear or pale yellow.  Resume your normal diet as instructed by your health care provider. Avoid heavy or fried foods that are hard to digest.  Avoid drinking alcohol for as long as instructed by your health care provider. Contact a health care provider if:  You have blood in your stool 2-3 days after the procedure. Get help right away if:  You have more than a small spotting of blood in your stool.  You pass large blood clots in your stool.  Your abdomen is swollen.  You have nausea or vomiting.  You have a fever.  You have increasing abdominal pain that is not relieved with medicine. This information is not intended to replace advice given to you by your health care provider. Make sure you discuss any questions you have with your health care provider. Document Released: 08/31/2003 Document Revised: 10/11/2015 Document Reviewed: 03/30/2015 Elsevier Interactive Patient Education  2018 [place] Anesthesia is a term that refers to techniques, procedures, and medicines that help a person stay safe and comfortable  during a medical procedure. Monitored anesthesia care, or sedation, is one type of anesthesia. Your anesthesia specialist may recommend sedation if you will be having a procedure that does not require you to be unconscious, such as:  Cataract surgery.  A dental procedure.  A biopsy.  A colonoscopy.  During the procedure, you may receive a medicine to help you relax (sedative). There are three levels of sedation:  Mild sedation. At this level, you may feel awake and relaxed. You will be able to follow directions.  Moderate sedation. At this level, you will be sleepy. You may not remember the procedure.  Deep sedation. At this level, you will be asleep. You will not remember the procedure.  The more medicine you are given, the deeper your level of sedation will be. Depending on how you respond to the procedure, the anesthesia specialist may change your level of sedation or the type of anesthesia to fit your needs. An anesthesia specialist will monitor you closely during the procedure. Let your health care provider know about:  Any allergies you have.  All medicines you are taking, including vitamins, herbs, eye drops, creams, and over-the-counter medicines.  Any use of steroids (by mouth or as a cream).  Any problems you or family members have had with sedatives and anesthetic medicines.  Any blood disorders you have.  Any surgeries you have had.  Any medical conditions you have, such as sleep apnea.  Whether you are pregnant or may be pregnant.  Any use of cigarettes, alcohol, or street drugs. What are the risks? Generally, this is a safe procedure. However, problems may occur, including:  Getting too much medicine (oversedation).  Nausea.  Allergic reaction to medicines.  Trouble breathing. If this happens, a breathing tube may be used to help with breathing. It will be removed when you are awake and breathing on your own.  Heart trouble.  Lung trouble.  Before  the procedure Staying hydrated Follow instructions from your health care provider about hydration, which may include:  Up to 2 hours before the procedure - you may continue to drink clear liquids, such as water, clear fruit juice, black coffee, and plain tea.  Eating and drinking restrictions  Follow instructions from your health care provider about eating and drinking, which may include:  8 hours before the procedure - stop eating heavy meals or foods such as meat, fried foods, or fatty foods.  6 hours before the procedure - stop eating light meals or foods, such as toast or cereal.  6 hours before the procedure - stop drinking milk or drinks that contain milk.  2 hours before the procedure - stop drinking clear liquids.  Medicines Ask your health care provider about:  Changing or stopping your regular medicines. This is especially important if you are taking diabetes medicines or blood thinners.  Taking medicines such as aspirin and ibuprofen. These medicines can thin your blood. Do not take these medicines before your procedure if your health care provider instructs you not to.  Tests and exams  You will have a physical exam.  You may have blood tests done to show: ? How well your kidneys and liver are working. ? How well your blood can clot.  General instructions  Plan to have someone take you home from the hospital or clinic.  If you will be going home right after the procedure, plan to have someone with you for 24 hours.  What happens during the procedure?  Your blood pressure, heart rate, breathing, level of pain and overall condition will be monitored.  An IV tube will be inserted into one of your veins.  Your anesthesia specialist will give you medicines as needed to keep you comfortable during the procedure. This may mean changing the level of sedation.  The procedure will be performed. After the procedure  Your blood pressure, heart rate, breathing rate, and  blood oxygen level will be monitored until the medicines you were given have worn off.  Do not drive for 24 hours if you received a sedative.  You may: ? Feel sleepy, clumsy, or nauseous. ? Feel forgetful about what happened after the procedure. ? Have a sore throat if you had a breathing tube during the procedure. ? Vomit. This information is not intended to replace advice given to you by your health care provider. Make sure you discuss any questions you have with your health care provider. Document Released: 10/12/2004 Document Revised: 06/25/2015 Document Reviewed: 05/09/2015 Elsevier Interactive Patient Education  2018 [place], Care After These instructions provide you with information about caring for yourself after your procedure. Your health care provider may also give you more specific instructions. Your treatment has been planned according to current medical practices, but problems sometimes occur. Call your health care provider if you have any problems or questions after your procedure. What can I expect after the procedure? After your procedure, it is common to:  Feel sleepy for several hours.  Feel clumsy and have poor balance for several hours.  Feel forgetful about what happened after the procedure.  Have poor judgment for several hours.  Feel nauseous or vomit.  Have a sore throat if you had a breathing tube during the procedure.  Follow these instructions at home: For at least 24 hours after the procedure:   Do not: ? Participate in activities in which you could fall or become injured. ? Drive. ? Use heavy machinery. ? Drink alcohol. ? Take sleeping pills or medicines that cause drowsiness. ? Make important decisions or sign legal documents. ? Take care of children on your own.  Rest. Eating and drinking  Follow the diet that is recommended by your health care provider.  If you vomit, drink water, juice, or soup when you  can drink without vomiting.  Make sure you have little or no nausea before eating solid foods. General instructions  Have a responsible adult stay with you until you are awake and alert.  Take over-the-counter and prescription medicines only as told by your health care provider.  If you smoke, do not smoke without supervision.  Keep all follow-up visits as told by your health care provider. This is important. Contact a health care provider if:  You keep feeling nauseous or you keep vomiting.  You feel light-headed.  You develop a rash.  You have a fever. Get help right away if:  You have trouble breathing. This information is not intended to replace advice given to you by your health care provider. Make sure you discuss any questions you have with your health care provider. Document Released: 05/09/2015 Document Revised: 09/08/2015 Document Reviewed: 05/09/2015 Elsevier Interactive Patient Education  [employer].

## 2017-12-03 ENCOUNTER — Telehealth: Payer: Self-pay | Admitting: Cardiovascular Disease

## 2017-12-03 NOTE — Telephone Encounter (Signed)
Notes recorded by Laurine Blazer, LPN on 86/07/6718 at 9:47 PM EST Left message to return call.  ------  Notes recorded by Herminio Commons, MD on 12/03/2017 at 3:24 PM EST It was 30 mmHg in 2014. This is not a significant difference. ------  Notes recorded by Laurine Blazer, LPN on 10/06/2834 at 6:29 PM EST Patient notified. Copy to pmd. Follow up scheduled for January with Dr. Bronson Ing. Patient concerned about her pulmonary pressure - Pulmonary arteries: PA peak pressure: 33 mm Hg (S). Wanted to know how this compared to previous echos.   ------  Notes recorded by Herminio Commons, MD on 11/29/2017 at 9:57 AM EDT This study demonstrates: Normal cardiac function, mild mitral and tricuspid valve leakage. Medication changes / Follow up studies / Other recommendations:  None. Please send results to the PCP: Monico Blitz, MD  Kate Sable, MD 11/29/2017 9:54 AM

## 2017-12-03 NOTE — Telephone Encounter (Signed)
Notes recorded by Laurine Blazer, LPN on 19/05/930 at 6:71 PM EST Patient notified and verbalized understanding.

## 2017-12-03 NOTE — Telephone Encounter (Signed)
Patient calling for test results. °

## 2017-12-05 ENCOUNTER — Other Ambulatory Visit: Payer: Self-pay

## 2017-12-05 ENCOUNTER — Encounter (HOSPITAL_COMMUNITY): Payer: Self-pay

## 2017-12-05 ENCOUNTER — Encounter (HOSPITAL_COMMUNITY)
Admission: RE | Admit: 2017-12-05 | Discharge: 2017-12-05 | Disposition: A | Discharge status: Home / Self Care | Payer: BLUE CROSS/BLUE SHIELD | Source: Ambulatory Visit | Attending: Internal Medicine | Admitting: Internal Medicine

## 2017-12-05 DIAGNOSIS — Z01812 Encounter for preprocedural laboratory examination: Secondary | ICD-10-CM | POA: Insufficient documentation

## 2017-12-05 HISTORY — DX: Nausea with vomiting, unspecified: R11.2

## 2017-12-05 HISTORY — DX: Other specified postprocedural states: Z98.890

## 2017-12-05 LAB — BASIC METABOLIC PANEL
Anion gap: 11 (ref 5–15)
BUN: 11 mg/dL (ref 6–20)
CALCIUM: 8.7 mg/dL — AB (ref 8.9–10.3)
CO2: 24 mmol/L (ref 22–32)
Chloride: 103 mmol/L (ref 98–111)
Creatinine, Ser: 0.77 mg/dL (ref 0.44–1.00)
GFR calc non Af Amer: >60 mL/min (ref >60)
Glucose, Bld: 96 mg/dL (ref 70–99)
Potassium: 3.8 mmol/L (ref 3.5–5.1)
SODIUM: 138 mmol/L (ref 135–145)

## 2017-12-05 LAB — CBC WITH DIFFERENTIAL/PLATELET
Abs Immature Granulocytes: 0.09 10*3/uL — ABNORMAL HIGH (ref 0.00–0.07)
BASOS ABS: 0.1 10*3/uL (ref 0.0–0.1)
BASOS PCT: 1 %
EOS ABS: 0.2 10*3/uL (ref 0.0–0.5)
EOS PCT: 2 %
HCT: 40.9 % (ref 36.0–46.0)
HEMOGLOBIN: 13.4 g/dL (ref 12.0–15.0)
Immature Granulocytes: 1 %
Lymphocytes Relative: 22 %
Lymphs Abs: 2.2 10*3/uL (ref 0.7–4.0)
MCH: 30.3 pg (ref 26.0–34.0)
MCHC: 32.8 g/dL (ref 30.0–36.0)
MCV: 92.5 fL (ref 80.0–100.0)
Monocytes Absolute: 0.7 10*3/uL (ref 0.1–1.0)
Monocytes Relative: 7 %
NRBC: 0 % (ref 0.0–0.2)
Neutro Abs: 6.8 10*3/uL (ref 1.7–7.7)
Neutrophils Relative %: 67 %
PLATELETS: 295 10*3/uL (ref 150–400)
RBC: 4.42 MIL/uL (ref 3.87–5.11)
RDW: 13.1 % (ref 11.5–15.5)
WBC: 10 10*3/uL (ref 4.0–10.5)

## 2017-12-05 LAB — HCG, SERUM, QUALITATIVE: PREG SERUM: NEGATIVE

## 2017-12-10 ENCOUNTER — Ambulatory Visit (HOSPITAL_COMMUNITY): Payer: BLUE CROSS/BLUE SHIELD | Admitting: Anesthesiology

## 2017-12-10 ENCOUNTER — Ambulatory Visit (HOSPITAL_COMMUNITY)
Admission: RE | Admit: 2017-12-10 | Discharge: 2017-12-10 | Disposition: A | Discharge status: Home / Self Care | Payer: BLUE CROSS/BLUE SHIELD | Source: Ambulatory Visit | Attending: Internal Medicine | Admitting: Internal Medicine

## 2017-12-10 ENCOUNTER — Other Ambulatory Visit: Payer: Self-pay

## 2017-12-10 ENCOUNTER — Encounter (HOSPITAL_COMMUNITY): Payer: Self-pay | Admitting: *Deleted

## 2017-12-10 ENCOUNTER — Encounter (HOSPITAL_COMMUNITY)
Admission: RE | Disposition: A | Discharge status: Home / Self Care | Payer: Self-pay | Source: Ambulatory Visit | Attending: Internal Medicine

## 2017-12-10 DIAGNOSIS — F329 Major depressive disorder, single episode, unspecified: Secondary | ICD-10-CM | POA: Insufficient documentation

## 2017-12-10 DIAGNOSIS — Z87891 Personal history of nicotine dependence: Secondary | ICD-10-CM | POA: Diagnosis not present

## 2017-12-10 DIAGNOSIS — K5900 Constipation, unspecified: Secondary | ICD-10-CM

## 2017-12-10 DIAGNOSIS — Z8042 Family history of malignant neoplasm of prostate: Secondary | ICD-10-CM | POA: Diagnosis not present

## 2017-12-10 DIAGNOSIS — Z8379 Family history of other diseases of the digestive system: Secondary | ICD-10-CM | POA: Insufficient documentation

## 2017-12-10 DIAGNOSIS — I340 Nonrheumatic mitral (valve) insufficiency: Secondary | ICD-10-CM | POA: Insufficient documentation

## 2017-12-10 DIAGNOSIS — I493 Ventricular premature depolarization: Secondary | ICD-10-CM | POA: Insufficient documentation

## 2017-12-10 DIAGNOSIS — R42 Dizziness and giddiness: Secondary | ICD-10-CM | POA: Insufficient documentation

## 2017-12-10 DIAGNOSIS — Z6841 Body Mass Index (BMI) 40.0 and over, adult: Secondary | ICD-10-CM | POA: Insufficient documentation

## 2017-12-10 DIAGNOSIS — Z79899 Other long term (current) drug therapy: Secondary | ICD-10-CM | POA: Insufficient documentation

## 2017-12-10 DIAGNOSIS — F419 Anxiety disorder, unspecified: Secondary | ICD-10-CM | POA: Diagnosis not present

## 2017-12-10 DIAGNOSIS — G4730 Sleep apnea, unspecified: Secondary | ICD-10-CM | POA: Diagnosis not present

## 2017-12-10 DIAGNOSIS — R197 Diarrhea, unspecified: Secondary | ICD-10-CM

## 2017-12-10 DIAGNOSIS — Z1211 Encounter for screening for malignant neoplasm of colon: Secondary | ICD-10-CM | POA: Diagnosis not present

## 2017-12-10 DIAGNOSIS — K219 Gastro-esophageal reflux disease without esophagitis: Secondary | ICD-10-CM | POA: Diagnosis not present

## 2017-12-10 DIAGNOSIS — R1013 Epigastric pain: Secondary | ICD-10-CM

## 2017-12-10 DIAGNOSIS — I10 Essential (primary) hypertension: Secondary | ICD-10-CM | POA: Insufficient documentation

## 2017-12-10 DIAGNOSIS — Z820 Family history of epilepsy and other diseases of the nervous system: Secondary | ICD-10-CM | POA: Insufficient documentation

## 2017-12-10 DIAGNOSIS — R198 Other specified symptoms and signs involving the digestive system and abdomen: Secondary | ICD-10-CM

## 2017-12-10 DIAGNOSIS — R002 Palpitations: Secondary | ICD-10-CM | POA: Diagnosis not present

## 2017-12-10 HISTORY — PX: COLONOSCOPY WITH PROPOFOL: SHX5780

## 2017-12-10 SURGERY — COLONOSCOPY WITH PROPOFOL
Anesthesia: Monitor Anesthesia Care

## 2017-12-10 MED ORDER — CHLORHEXIDINE GLUCONATE CLOTH 2 % EX PADS: 6.0000 | MEDICATED_PAD | Freq: Once | CUTANEOUS | Status: DC

## 2017-12-10 MED ORDER — PROPOFOL 10 MG/ML IV BOLUS
INTRAVENOUS | Status: DC | PRN
  Administered 2017-12-10: 20 mg via INTRAVENOUS
  Administered 2017-12-10: 40 mg via INTRAVENOUS
  Administered 2017-12-10: 10 mg via INTRAVENOUS

## 2017-12-10 MED ORDER — LACTATED RINGERS IV SOLN: INTRAVENOUS | Status: DC

## 2017-12-10 MED ORDER — PROMETHAZINE HCL 25 MG/ML IJ SOLN: 6.2500 mg | INTRAMUSCULAR | Status: DC | PRN

## 2017-12-10 MED ORDER — HYDROMORPHONE HCL 1 MG/ML IJ SOLN: 0.2500 mg | INTRAMUSCULAR | Status: DC | PRN

## 2017-12-10 MED ORDER — PROPOFOL 500 MG/50ML IV EMUL
INTRAVENOUS | Status: DC | PRN
  Administered 2017-12-10: 13:00:00 via INTRAVENOUS
  Administered 2017-12-10: 150 ug/kg/min via INTRAVENOUS

## 2017-12-10 MED ORDER — STERILE WATER FOR IRRIGATION IR SOLN
Status: DC | PRN
  Administered 2017-12-10: 100 mL

## 2017-12-10 MED ORDER — PROPOFOL 10 MG/ML IV BOLUS
INTRAVENOUS | Status: AC
  Filled 2017-12-10: qty 40

## 2017-12-10 MED ORDER — MIDAZOLAM HCL 2 MG/2ML IJ SOLN
INTRAMUSCULAR | Status: AC
  Filled 2017-12-10: qty 2

## 2017-12-10 MED ORDER — LACTATED RINGERS IV SOLN
INTRAVENOUS | Status: DC
  Administered 2017-12-10: 12:00:00 via INTRAVENOUS

## 2017-12-10 MED ORDER — LIDOCAINE HCL 1 % IJ SOLN
INTRAMUSCULAR | Status: DC | PRN
  Administered 2017-12-10: 50 mg via INTRADERMAL

## 2017-12-10 MED ORDER — MIDAZOLAM HCL 5 MG/5ML IJ SOLN
INTRAMUSCULAR | Status: DC | PRN
  Administered 2017-12-10: 2 mg via INTRAVENOUS

## 2017-12-10 MED ORDER — HYDROCODONE-ACETAMINOPHEN 7.5-325 MG PO TABS: 1.0000 | ORAL_TABLET | Freq: Once | ORAL | Status: DC | PRN

## 2017-12-10 MED ORDER — MEPERIDINE HCL 100 MG/ML IJ SOLN: 6.2500 mg | INTRAMUSCULAR | Status: DC | PRN

## 2017-12-10 NOTE — Anesthesia Postprocedure Evaluation (Signed)
Anesthesia Post Note  Patient: Rachel Holland  Procedure(s) Performed: COLONOSCOPY WITH PROPOFOL (N/A )  Patient location during evaluation: PACU Anesthesia Type: MAC Level of consciousness: awake and alert and oriented Pain management: pain level controlled Vital Signs Assessment: post-procedure vital signs reviewed and stable Respiratory status: spontaneous breathing Cardiovascular status: blood pressure returned to baseline Postop Assessment: adequate PO intake and no apparent nausea or vomiting Anesthetic complications: no     Last Vitals:  Vitals:   12/10/17 1108  BP: 120/61  Pulse: 74  Resp: 20  Temp: 36.9 C  SpO2: 98%    Last Pain:  Vitals:   12/10/17 1317  TempSrc:   PainSc: 0-No pain                 Yvaine Jankowiak

## 2017-12-10 NOTE — Discharge Instructions (Signed)
°  Colonoscopy Discharge Instructions  Read the instructions outlined below and refer to this sheet in the next few weeks. These discharge instructions provide you with general information on caring for yourself after you leave the hospital. Your doctor may also give you specific instructions. While your treatment has been planned according to the most current medical practices available, unavoidable complications occasionally occur. If you have any problems or questions after discharge, call Dr. Gala Romney at [phone]. ACTIVITY  You may resume your regular activity, but move at a slower pace for the next 24 hours.   Take frequent rest periods for the next 24 hours.   Walking will help get rid of the air and reduce the bloated feeling in your belly (abdomen).   No driving for 24 hours (because of the medicine (anesthesia) used during the test).    Do not sign any important legal documents or operate any machinery for 24 hours (because of the anesthesia used during the test).  NUTRITION  Drink plenty of fluids.   You may resume your normal diet as instructed by your doctor.   Begin with a light meal and progress to your normal diet. Heavy or fried foods are harder to digest and may make you feel sick to your stomach (nauseated).   Avoid alcoholic beverages for 24 hours or as instructed.  MEDICATIONS  You may resume your normal medications unless your doctor tells you otherwise.  WHAT YOU CAN EXPECT TODAY  Some feelings of bloating in the abdomen.   Passage of more gas than usual.   Spotting of blood in your stool or on the toilet paper.  IF YOU HAD POLYPS REMOVED DURING THE COLONOSCOPY:  No aspirin products for 7 days or as instructed.   No alcohol for 7 days or as instructed.   Eat a soft diet for the next 24 hours.  FINDING OUT THE RESULTS OF YOUR TEST Not all test results are available during your visit. If your test results are not back during the visit, make an appointment  with your caregiver to find out the results. Do not assume everything is normal if you have not heard from your caregiver or the medical facility. It is important for you to follow up on all of your test results.  SEEK IMMEDIATE MEDICAL ATTENTION IF:  You have more than a spotting of blood in your stool.   Your belly is swollen (abdominal distention).   You are nauseated or vomiting.   You have a temperature over 101.   You have abdominal pain or discomfort that is severe or gets worse throughout the day.    Your colon looked good today.  You should have a repeat colonoscopy in 10 years for screening purposes  Begin MiraLAX one half a capful at bedtime nightly  Keep a stool diary  Office visit with [place] in 3 months
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
## 2017-12-10 NOTE — Anesthesia Preprocedure Evaluation (Signed)
Anesthesia Evaluation    History of Anesthesia Complications (+) history of anesthetic complications  Airway Mallampati: II       Dental  (+) Teeth Intact, Dental Advidsory Given   Pulmonary sleep apnea , former smoker,    breath sounds clear to auscultation + decreased breath sounds      Cardiovascular hypertension, On Medications + dysrhythmias  Rhythm:regular     Neuro/Psych PSYCHIATRIC DISORDERS Anxiety Depression    GI/Hepatic GERD  Medicated,  Endo/Other    Renal/GU      Musculoskeletal   Abdominal   Peds  Hematology   Anesthesia Other Findings H/O palpitations.  Echo last week  EF at 65%, PVCs Morbid obesity  Reproductive/Obstetrics                             Anesthesia Physical Anesthesia Plan  ASA: III  Anesthesia Plan: MAC   Post-op Pain Management:    Induction:   PONV Risk Score and Plan:   Airway Management Planned:   Additional Equipment:   Intra-op Plan:   Post-operative Plan:   Informed Consent:   Plan Discussed with: Anesthesiologist  Anesthesia Plan Comments:         Anesthesia Quick Evaluation

## 2017-12-10 NOTE — Transfer of Care (Signed)
Immediate Anesthesia Transfer of Care Note  Patient: Rachel Holland  Procedure(s) Performed: COLONOSCOPY WITH PROPOFOL (N/A )  Patient Location: PACU  Anesthesia Type:MAC  Level of Consciousness: awake  Airway & Oxygen Therapy: Patient Spontanous Breathing  Post-op Assessment: Report given to RN  Post vital signs: Reviewed  Last Vitals:  Vitals Value Taken Time  BP    Temp    Pulse 68 12/10/2017  1:23 PM  Resp 20 12/10/2017  1:23 PM  SpO2 99 % 12/10/2017  1:23 PM  Vitals shown include unvalidated device data.  Last Pain:  Vitals:   12/10/17 1317  TempSrc:   PainSc: 0-No pain      Patients Stated Pain Goal: 7 (36/46/80 3212)  Complications: No apparent anesthesia complications

## 2017-12-10 NOTE — Op Note (Signed)
[facility] Patient Name: Rachel Holland Procedure Date: 12/10/2017 12:46 PM MRN: [id number] Date of Birth: [birth year] Attending MD: Norvel Richards , MD CSN: [id number] Age: 47 Admit Type: Outpatient Procedure:                Colonoscopy Indications:              Screening for colorectal malignant neoplasm Providers:                Norvel Richards, MD, Rosina Lowenstein, RN, Randa Spike, Technician Referring MD:              Medicines:                Propofol per Anesthesia Complications:            No immediate complications. Estimated Blood Loss:     Estimated blood loss: none. Procedure:                Pre-Anesthesia Assessment:                           - Prior to the procedure, a History and Physical                            was performed, and patient medications and                            allergies were reviewed. The patient's tolerance of                            previous anesthesia was also reviewed. The risks                            and benefits of the procedure and the sedation                            options and risks were discussed with the patient.                            All questions were answered, and informed consent                            was obtained. Prior Anticoagulants: The patient has                            taken no previous anticoagulant or antiplatelet                            agents. ASA Grade Assessment: II - A patient with                            mild systemic disease. After reviewing the risks  and benefits, the patient was deemed in                            satisfactory condition to undergo the procedure.                           After obtaining informed consent, the colonoscope                            was passed under direct vision. Throughout the                            procedure, the patient's blood pressure, pulse, and     oxygen saturations were monitored continuously. The                            [id number]-[id number] ([id number]) scope was introduced through                            the and advanced to the the cecum, identified by                            appendiceal orifice and ileocecal valve. The                            colonoscopy was performed without difficulty. The                            patient tolerated the procedure well. The quality                            of the bowel preparation was adequate. Scope In: 1:04:27 PM Scope Out: 1:15:03 PM Scope Withdrawal Time: 0 hours 6 minutes 21 seconds  Total Procedure Duration: 0 hours 10 minutes 36 seconds  Findings:      The perianal and digital rectal examinations were normal.      The colon (entire examined portion) appeared normal.      The retroflexed view of the distal rectum and anal verge was normal and       showed no anal or rectal abnormalities. Impression:               - The entire examined colon is normal.                           - The distal rectum and anal verge are normal on                            retroflexion view.                           - No specimens collected. Moderate Sedation:      Moderate (conscious) sedation was personally administered by an       anesthesia professional. The following parameters were monitored: oxygen       saturation, heart rate, blood pressure, respiratory rate, EKG, adequacy  of pulmonary ventilation, and response to care. Recommendation:           - Patient has a contact number available for                            emergencies. The signs and symptoms of potential                            delayed complications were discussed with the                            patient. Return to normal activities tomorrow.                            Written discharge instructions were provided to the                            patient.                           - Resume previous diet.                            - Continue present medications. Begin MiraLAX one                            half capful at bedtime nightly- Await pathology                            results. Keep a stool diary.                           - Repeat colonoscopy in 10 years for screening                            purposes.                           - Return to GI office in 3 months. Procedure Code(s):        --- Professional ---                           [phone], Colonoscopy, flexible; diagnostic, including                            collection of specimen(s) by brushing or washing,                            when performed (separate procedure) Diagnosis Code(s):        --- Professional ---                           Z12.11, Encounter for screening for malignant                            neoplasm of colon CPT copyright 2018 American  Medical Association. All rights reserved. The codes documented in this report are preliminary and upon coder review may  be revised to meet current compliance requirements. Cristopher Estimable. Nigel Wessman, MD Norvel Richards, MD 12/10/2017 1:21:35 PM This report has been signed electronically. Number of Addenda: 0

## 2017-12-13 ENCOUNTER — Telehealth: Payer: Self-pay | Admitting: *Deleted

## 2017-12-13 NOTE — Telephone Encounter (Signed)
Notes recorded by Laurine Blazer, LPN on 09/31/1216 at 11:02 AM EST Patient notified. Copy to pmd. Follow up scheduled for 02/15/18 with Dr. Bronson Ing. ------  Notes recorded by Herminio Commons, MD on 12/11/2017 at 12:42 PM EST No significant arrhythmias.

## 2017-12-14 ENCOUNTER — Encounter (HOSPITAL_COMMUNITY): Payer: Self-pay | Admitting: Internal Medicine

## 2018-02-14 ENCOUNTER — Telehealth: Payer: Self-pay | Admitting: Cardiovascular Disease

## 2018-02-14 NOTE — Telephone Encounter (Signed)
Called patient and LVM to call and confirm appt with Dr. Bronson Ing.

## 2018-02-15 ENCOUNTER — Ambulatory Visit: Payer: BLUE CROSS/BLUE SHIELD | Admitting: Cardiovascular Disease

## 2018-02-20 LAB — LIPID PANEL
Cholesterol: 217 — AB (ref 0–200)
HDL: 60 (ref 35–70)
LDL Cholesterol: 117
Triglycerides: 199 — AB (ref 40–160)

## 2018-02-20 LAB — HEMOGLOBIN A1C: Hemoglobin A1C: 5.7

## 2018-03-12 ENCOUNTER — Ambulatory Visit: Payer: BLUE CROSS/BLUE SHIELD | Admitting: Gastroenterology

## 2018-03-14 ENCOUNTER — Ambulatory Visit: Payer: BLUE CROSS/BLUE SHIELD | Admitting: Gastroenterology

## 2018-04-05 ENCOUNTER — Ambulatory Visit: Payer: BLUE CROSS/BLUE SHIELD | Admitting: Cardiovascular Disease

## 2018-04-08 ENCOUNTER — Encounter: Payer: Self-pay | Admitting: Cardiovascular Disease

## 2018-05-26 ENCOUNTER — Other Ambulatory Visit: Payer: Self-pay | Admitting: Nurse Practitioner

## 2018-05-27 NOTE — Telephone Encounter (Signed)
Spoke with pt. Pt is suppose to take this medication twice daily. She is taking it twice daily as needed only.

## 2018-05-27 NOTE — Telephone Encounter (Signed)
Is she taking PPI once daily or BID?

## 2018-05-29 NOTE — Telephone Encounter (Signed)
Completed.

## 2018-05-29 NOTE — Telephone Encounter (Signed)
Noted  

## 2018-06-07 ENCOUNTER — Ambulatory Visit: Payer: BLUE CROSS/BLUE SHIELD | Admitting: Cardiovascular Disease

## 2018-06-10 ENCOUNTER — Encounter: Payer: Self-pay | Admitting: "Endocrinology

## 2018-06-10 ENCOUNTER — Ambulatory Visit (INDEPENDENT_AMBULATORY_CARE_PROVIDER_SITE_OTHER): Payer: BLUE CROSS/BLUE SHIELD | Admitting: "Endocrinology

## 2018-06-10 ENCOUNTER — Other Ambulatory Visit: Payer: Self-pay

## 2018-06-10 VITALS — BP 130/61 | HR 62 | Ht 62.0 in | Wt 298.0 lb

## 2018-06-10 DIAGNOSIS — E559 Vitamin D deficiency, unspecified: Secondary | ICD-10-CM

## 2018-06-10 DIAGNOSIS — R7303 Prediabetes: Secondary | ICD-10-CM | POA: Insufficient documentation

## 2018-06-10 DIAGNOSIS — D3500 Benign neoplasm of unspecified adrenal gland: Secondary | ICD-10-CM | POA: Diagnosis not present

## 2018-06-10 MED ORDER — METFORMIN HCL ER 500 MG PO TB24: 500.0000 mg | ORAL_TABLET | Freq: Every day | ORAL | 3 refills | Status: DC
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
## 2018-07-01 ENCOUNTER — Other Ambulatory Visit: Payer: Self-pay | Admitting: "Endocrinology

## 2018-07-06 LAB — CORTISOL, FREE 24 HR URINE
Cortisol (Ur), Free: 21.8 mcg/24 h (ref 4.0–50.0)
Cortisol, Free ratio to CRT: 15.7 mcg/g creat
RESULTS RECEIVED: 1.39 g/(24.h) (ref 0.50–2.15)
Total Volume: 1500 mL

## 2018-07-06 LAB — CATECHOLAMINES, FRACTIONATED, URINE, 24 HOUR
Calculated Total (E+NE): 48 mcg/24 h (ref 26–121)
Creatinine, Urine mg/day-CATEUR: 1.36 g/(24.h) (ref 0.50–2.15)
Dopamine, 24 hr Urine: 179 mcg/24 h (ref 52–480)
Epinephrine, 24 hr Urine: 10 mcg/24 h (ref 2–24)
Norepinephrine, 24 hr Ur: 38 mcg/24 h (ref 15–100)
Volume, Urine-VMAUR: 1500 mL

## 2018-07-06 LAB — METANEPHRINES, URINE, 24 HOUR
Metaneph Total, Ur: 421 mcg/24 h (ref 182–739)
Metanephrines, Ur: 116 mcg/24 h (ref 58–203)
Normetanephrine, 24H Ur: 305 mcg/24 h (ref 88–649)
Volume, Urine-VMAUR: 1500 mL

## 2018-07-09 ENCOUNTER — Ambulatory Visit: Payer: BLUE CROSS/BLUE SHIELD | Admitting: "Endocrinology

## 2018-07-19 ENCOUNTER — Ambulatory Visit (INDEPENDENT_AMBULATORY_CARE_PROVIDER_SITE_OTHER): Payer: BC Managed Care – PPO | Admitting: "Endocrinology

## 2018-07-19 ENCOUNTER — Encounter: Payer: Self-pay | Admitting: "Endocrinology

## 2018-07-19 ENCOUNTER — Other Ambulatory Visit: Payer: Self-pay

## 2018-07-19 DIAGNOSIS — D3500 Benign neoplasm of unspecified adrenal gland: Secondary | ICD-10-CM | POA: Diagnosis not present

## 2018-07-19 MED ORDER — METFORMIN HCL ER 500 MG PO TB24: 500.0000 mg | ORAL_TABLET | Freq: Every day | ORAL | 6 refills | Status: DC
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
## 2018-08-21 ENCOUNTER — Telehealth: Payer: Self-pay | Admitting: Cardiovascular Disease

## 2018-08-21 NOTE — Telephone Encounter (Signed)
Virtual Visit Pre-Appointment Phone Call  "(Name), I am calling you today to discuss your upcoming appointment. We are currently trying to limit exposure to the virus that causes COVID-19 by seeing patients at home rather than in the office."  "What is the BEST phone number to call the day of the visit?" [phone] 1. Do you have or have access to (through a family member/friend) a smartphone with video capability that we can use for your visit?" a. If yes - list this number in appt notes as cell (if different from BEST phone #) and list the appointment type as a VIDEO visit in appointment notes b. If no - list the appointment type as a PHONE visit in appointment notes  2. Confirm consent - "In the setting of the current Covid19 crisis, you are scheduled for a (phone or video) visit with your provider on (date) at (time).  Just as we do with many in-office visits, in order for you to participate in this visit, we must obtain consent.  If you'd like, I can send this to your mychart (if signed up) or email for you to review.  Otherwise, I can obtain your verbal consent now.  All virtual visits are billed to your insurance company just like a normal visit would be.  By agreeing to a virtual visit, we'd like you to understand that the technology does not allow for your provider to perform an examination, and thus may limit your provider's ability to fully assess your condition. If your provider identifies any concerns that need to be evaluated in person, we will make arrangements to do so.  Finally, though the technology is pretty good, we cannot assure that it will always work on either your or our end, and in the setting of a video visit, we may have to convert it to a phone-only visit.  In either situation, we cannot ensure that we have a secure connection.  Are you willing to proceed?" STAFF: Did the patient verbally acknowledge consent to telehealth visit? Document YES/NO here: yes  3. Advise  patient to be prepared - "Two hours prior to your appointment, go ahead and check your blood pressure, pulse, oxygen saturation, and your weight (if you have the equipment to check those) and write them all down. When your visit starts, your provider will ask you for this information. If you have an Apple Watch or Kardia device, please plan to have heart rate information ready on the day of your appointment. Please have a pen and paper handy nearby the day of the visit as well."  4. Give patient instructions for MyChart download to smartphone OR Doximity/Doxy.me as below if video visit (depending on what platform provider is using)  5. Inform patient they will receive a phone call 15 minutes prior to their appointment time (may be from unknown caller ID) so they should be prepared to answer    TELEPHONE CALL NOTE  Rachel Holland has been deemed a candidate for a follow-up tele-health visit to limit community exposure during the Covid-19 pandemic. I spoke with the patient via phone to ensure availability of phone/video source, confirm preferred email & phone number, and discuss instructions and expectations.  I reminded Rachel Holland to be prepared with any vital sign and/or heart rhythm information that could potentially be obtained via home monitoring, at the time of her visit. I reminded Rachel Holland to expect a phone call prior to her visit.  Rachel Holland 08/21/2018  12:57 PM   INSTRUCTIONS FOR DOWNLOADING THE MYCHART APP TO SMARTPHONE  - The patient must first make sure to have activated MyChart and know their login information - If Apple, go to [employer] and type in MyChart in the search bar and download the app. If Android, ask patient to go to [employer] and type in [place] in the search bar and download the app. The app is free but as with any other app downloads, their phone may require them to verify saved payment information or Apple/Android password.  - The  patient will need to then log into the app with their MyChart username and password, and select [facility] as their healthcare provider to link the account. When it is time for your visit, go to the MyChart app, find appointments, and click Begin Video Visit. Be sure to Select Allow for your device to access the Microphone and Camera for your visit. You will then be connected, and your provider will be with you shortly.  **If they have any issues connecting, or need assistance please contact MyChart service desk [phone])**  **If using a computer, in order to ensure the best quality for their visit they will need to use either of the following Internet Browsers: [employer], or Google Chrome**  IF USING DOXIMITY or DOXY.ME - The patient will receive a link just prior to their visit by text.     FULL LENGTH CONSENT FOR TELE-HEALTH VISIT   I hereby voluntarily request, consent and authorize [facility] and its employed or contracted physicians, physician assistants, nurse practitioners or other licensed health care professionals (the Practitioner), to provide me with telemedicine health care services (the Services") as deemed necessary by the treating Practitioner. I acknowledge and consent to receive the Services by the Practitioner via telemedicine. I understand that the telemedicine visit will involve communicating with the Practitioner through live audiovisual communication technology and the disclosure of certain medical information by electronic transmission. I acknowledge that I have been given the opportunity to request an in-person assessment or other available alternative prior to the telemedicine visit and am voluntarily participating in the telemedicine visit.  I understand that I have the right to withhold or withdraw my consent to the use of telemedicine in the course of my care at any time, without affecting my right to future care or treatment, and that the  Practitioner or I may terminate the telemedicine visit at any time. I understand that I have the right to inspect all information obtained and/or recorded in the course of the telemedicine visit and may receive copies of available information for a reasonable fee.  I understand that some of the potential risks of receiving the Services via telemedicine include:   Delay or interruption in medical evaluation due to technological equipment failure or disruption;  Information transmitted may not be sufficient (e.g. poor resolution of images) to allow for appropriate medical decision making by the Practitioner; and/or   In rare instances, security protocols could fail, causing a breach of personal health information.  Furthermore, I acknowledge that it is my responsibility to provide information about my medical history, conditions and care that is complete and accurate to the best of my ability. I acknowledge that Practitioner's advice, recommendations, and/or decision may be based on factors not within their control, such as incomplete or inaccurate data provided by me or distortions of diagnostic images or specimens that may result from electronic transmissions. I understand that the practice of medicine is  not an Chief Strategy Officer and that Practitioner makes no warranties or guarantees regarding treatment outcomes. I acknowledge that I will receive a copy of this consent concurrently upon execution via email to the email address I last provided but may also request a printed copy by calling the office of [facility].    I understand that my insurance will be billed for this visit.   I have read or had this consent read to me.  I understand the contents of this consent, which adequately explains the benefits and risks of the Services being provided via telemedicine.   I have been provided ample opportunity to ask questions regarding this consent and the Services and have had my questions answered to my  satisfaction.  I give my informed consent for the services to be provided through the use of telemedicine in my medical care  By participating in this telemedicine visit I agree to the above.

## 2018-08-28 ENCOUNTER — Encounter: Payer: Self-pay | Admitting: Cardiovascular Disease

## 2018-08-28 ENCOUNTER — Telehealth (INDEPENDENT_AMBULATORY_CARE_PROVIDER_SITE_OTHER): Payer: BLUE CROSS/BLUE SHIELD | Admitting: Cardiovascular Disease

## 2018-08-28 VITALS — BP 132/80 | HR 86 | Ht 62.0 in | Wt 298.0 lb

## 2018-08-28 DIAGNOSIS — I10 Essential (primary) hypertension: Secondary | ICD-10-CM

## 2018-08-28 DIAGNOSIS — R002 Palpitations: Secondary | ICD-10-CM

## 2018-08-28 MED ORDER — METOPROLOL TARTRATE 25 MG PO TABS: 37.5000 mg | ORAL_TABLET | Freq: Two times a day (BID) | ORAL | 2 refills | Status: DC

## 2018-08-28 NOTE — Addendum Note (Signed)
Addended by: Merlene Laughter on: 08/28/2018 02:04 PM   Modules accepted: Orders

## 2018-08-28 NOTE — Patient Instructions (Addendum)
Medication Instructions:   Your physician has recommended you make the following change in your medication:   Change metoprolol tartrate to 37.5 mg (1&1/2 of your 25 mg tablets) by mouth twice daily  Continue all other medications the same  Labwork:  NONE  Testing/Procedures:  NONE  Follow-Up:  Your physician recommends that you schedule a follow-up appointment in: 1 year. You will receive a reminder letter in the mail in about 10 months reminding you to call and schedule your appointment. If you don't receive this letter, please contact our office.  Any Other Special Instructions Will Be Listed Below (If Applicable).  If you need a refill on your cardiac medications before your next appointment, please call your pharmacy.
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
## 2018-09-05 ENCOUNTER — Other Ambulatory Visit: Payer: Self-pay

## 2018-09-05 ENCOUNTER — Telehealth: Payer: Self-pay

## 2018-09-05 DIAGNOSIS — Z20822 Contact with and (suspected) exposure to covid-19: Secondary | ICD-10-CM

## 2018-09-05 NOTE — Telephone Encounter (Signed)
Pt notified by VM 

## 2018-09-06 LAB — NOVEL CORONAVIRUS, NAA: SARS-CoV-2, NAA: NOT DETECTED

## 2018-10-23 ENCOUNTER — Ambulatory Visit: Payer: BC Managed Care – PPO | Admitting: Neurology

## 2018-12-02 ENCOUNTER — Ambulatory Visit: Payer: BC Managed Care – PPO | Admitting: Neurology

## 2018-12-02 ENCOUNTER — Telehealth: Payer: Self-pay

## 2018-12-02 NOTE — Telephone Encounter (Signed)
Please follow dismissal protocol as per our No Show Policy.   

## 2018-12-02 NOTE — Telephone Encounter (Signed)
Letter is sent

## 2018-12-02 NOTE — Telephone Encounter (Addendum)
Pt did not show for their appt with Dr. Rexene Alberts today.  This is pt's second new patient no-show. Pt cancelled within 24 hours of her appt on 10/23/2018.  Per [facility] policy this is grounds for dismissal.

## 2019-01-19 ENCOUNTER — Encounter (HOSPITAL_COMMUNITY): Payer: Self-pay

## 2019-01-19 ENCOUNTER — Emergency Department (HOSPITAL_COMMUNITY)
Admission: EM | Admit: 2019-01-19 | Discharge: 2019-01-19 | Disposition: A | Discharge status: Home / Self Care | Payer: BC Managed Care – PPO | Attending: Emergency Medicine | Admitting: Emergency Medicine

## 2019-01-19 ENCOUNTER — Other Ambulatory Visit: Payer: Self-pay

## 2019-01-19 ENCOUNTER — Ambulatory Visit (INDEPENDENT_AMBULATORY_CARE_PROVIDER_SITE_OTHER)
Admission: EM | Admit: 2019-01-19 | Discharge: 2019-01-19 | Disposition: A | Discharge status: Home / Self Care | Payer: BC Managed Care – PPO | Source: Home / Self Care

## 2019-01-19 DIAGNOSIS — H5712 Ocular pain, left eye: Secondary | ICD-10-CM | POA: Diagnosis present

## 2019-01-19 DIAGNOSIS — Z87891 Personal history of nicotine dependence: Secondary | ICD-10-CM | POA: Insufficient documentation

## 2019-01-19 DIAGNOSIS — Z79899 Other long term (current) drug therapy: Secondary | ICD-10-CM | POA: Diagnosis not present

## 2019-01-19 DIAGNOSIS — I10 Essential (primary) hypertension: Secondary | ICD-10-CM | POA: Insufficient documentation

## 2019-01-19 DIAGNOSIS — H1132 Conjunctival hemorrhage, left eye: Secondary | ICD-10-CM | POA: Diagnosis not present
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
## 2019-01-19 NOTE — ED Triage Notes (Signed)
Pt reports broken blood vessel in left eye approx 1 week ago.  Reports got worse yesterday and reports headache.  Pt went to urgent care and was sent here for further evaluation.  Per urgent care visual acuity was 20/16 in left and r eye with glasses.

## 2019-01-19 NOTE — ED Triage Notes (Signed)
Pt presents with red blood shot left eye that developed 2 days ago, pt also states that this happened a couple weeks ago and improved in a few days, pt also has mild headache

## 2019-01-19 NOTE — Discharge Instructions (Addendum)
Return for any blood in the colored part of the eye.  Return for any severe headache or any visual changes.  The some conjunctival hemorrhage of the left eye by itself is not dangerous.  Make an appointment to follow-up with your your eye doctor.  Blood pressure today is well controlled.
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
## 2019-01-19 NOTE — Discharge Instructions (Addendum)
Patient was advised to go to ED for further evaluation 

## 2019-02-05 ENCOUNTER — Telehealth: Payer: Self-pay | Admitting: "Endocrinology

## 2019-02-05 ENCOUNTER — Encounter: Payer: Self-pay | Admitting: "Endocrinology

## 2019-02-05 NOTE — Telephone Encounter (Signed)
Attempted PA for Ct scheduled for June 2021. [employer] would not allow PA to be performed this early.

## 2019-07-03 ENCOUNTER — Telehealth: Payer: Self-pay | Admitting: "Endocrinology

## 2019-07-03 NOTE — Telephone Encounter (Signed)
Kendra at [facility] called and needs a call back regarding this pt's CT scan that is scheduled 6/7. She can be reached at [phone]

## 2019-07-04 NOTE — Telephone Encounter (Signed)
Called pt's insurance [employer] they stated Dr.Nida would need to perform a peer to peer for approval of CT abdomen without contrast. Information given to [place].

## 2019-07-07 ENCOUNTER — Ambulatory Visit (HOSPITAL_COMMUNITY): Payer: BC Managed Care – PPO

## 2019-07-07 NOTE — Telephone Encounter (Signed)
Dr.Nida called [employer] for peer to peer, received authorization for CT abdomen without contrast. Authorization # [id number], called Kiley at the pre[facility] center with the authorization.

## 2019-07-11 ENCOUNTER — Ambulatory Visit (HOSPITAL_COMMUNITY): Admission: RE | Admit: 2019-07-11 | Payer: BC Managed Care – PPO | Source: Ambulatory Visit

## 2019-07-14 ENCOUNTER — Other Ambulatory Visit: Payer: Self-pay

## 2019-07-14 ENCOUNTER — Ambulatory Visit (HOSPITAL_COMMUNITY)
Admission: RE | Admit: 2019-07-14 | Discharge: 2019-07-14 | Disposition: A | Discharge status: Home / Self Care | Payer: BC Managed Care – PPO | Source: Ambulatory Visit | Attending: "Endocrinology | Admitting: "Endocrinology

## 2019-07-14 ENCOUNTER — Telehealth: Payer: Self-pay | Admitting: "Endocrinology

## 2019-07-14 DIAGNOSIS — D3500 Benign neoplasm of unspecified adrenal gland: Secondary | ICD-10-CM | POA: Diagnosis not present

## 2019-07-14 NOTE — Telephone Encounter (Signed)
Opened in error

## 2019-07-17 NOTE — Telephone Encounter (Signed)
Stable findings , no concerning developments. We will discuss details on a return visit.

## 2019-07-17 NOTE — Telephone Encounter (Signed)
Discussed with pt, understanding voiced. 

## 2019-07-17 NOTE — Telephone Encounter (Signed)
Patient called to get her CT results. She was on the schedule for Monday but was not aware nor did she do her labs. She is leaving for the beach and would like to get those before she leaves.

## 2019-07-21 ENCOUNTER — Ambulatory Visit: Payer: BC Managed Care – PPO | Admitting: "Endocrinology

## 2019-08-28 ENCOUNTER — Ambulatory Visit (INDEPENDENT_AMBULATORY_CARE_PROVIDER_SITE_OTHER): Payer: BC Managed Care – PPO | Admitting: Family Medicine

## 2019-08-28 ENCOUNTER — Encounter: Payer: Self-pay | Admitting: Family Medicine

## 2019-08-28 VITALS — BP 142/80 | HR 89 | Ht 62.0 in | Wt 308.0 lb

## 2019-08-28 DIAGNOSIS — I10 Essential (primary) hypertension: Secondary | ICD-10-CM

## 2019-08-28 DIAGNOSIS — Z9989 Dependence on other enabling machines and devices: Secondary | ICD-10-CM

## 2019-08-28 DIAGNOSIS — R002 Palpitations: Secondary | ICD-10-CM

## 2019-08-28 DIAGNOSIS — G4733 Obstructive sleep apnea (adult) (pediatric): Secondary | ICD-10-CM

## 2019-08-28 MED ORDER — METOPROLOL SUCCINATE ER 25 MG PO TB24: 12.5000 mg | ORAL_TABLET | Freq: Every day | ORAL | 0 refills | Status: DC
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
## 2019-08-28 NOTE — Patient Instructions (Signed)
Medication Instructions:   Stop Lopressor (Metoprolol Tart).  Begin Toprol XL 12.5mg  daily.   Continue all other medications.    Labwork: none  Testing/Procedures: none  Follow-Up: 1 month   Any Other Special Instructions Will Be Listed Below (If Applicable).  If you need a refill on your cardiac medications before your next appointment, please call your pharmacy.

## 2019-08-29 NOTE — Addendum Note (Signed)
Addended by: Merlene Laughter on: 08/29/2019 08:36 AM   Modules accepted: Orders

## 2019-09-26 ENCOUNTER — Telehealth: Payer: Self-pay | Admitting: Family Medicine

## 2019-09-26 NOTE — Telephone Encounter (Signed)
  Patient Consent for Virtual Visit         Rachel Holland has provided verbal consent on 09/26/2019 for a virtual visit (video or telephone).   CONSENT FOR VIRTUAL VISIT FOR:  Rachel Holland  By participating in this virtual visit I agree to the following:  I hereby voluntarily request, consent and authorize [facility] and its employed or contracted physicians, physician assistants, nurse practitioners or other licensed health care professionals (the Practitioner), to provide me with telemedicine health care services (the "Services") as deemed necessary by the treating Practitioner. I acknowledge and consent to receive the Services by the Practitioner via telemedicine. I understand that the telemedicine visit will involve communicating with the Practitioner through live audiovisual communication technology and the disclosure of certain medical information by electronic transmission. I acknowledge that I have been given the opportunity to request an in-person assessment or other available alternative prior to the telemedicine visit and am voluntarily participating in the telemedicine visit.  I understand that I have the right to withhold or withdraw my consent to the use of telemedicine in the course of my care at any time, without affecting my right to future care or treatment, and that the Practitioner or I may terminate the telemedicine visit at any time. I understand that I have the right to inspect all information obtained and/or recorded in the course of the telemedicine visit and may receive copies of available information for a reasonable fee.  I understand that some of the potential risks of receiving the Services via telemedicine include:  [name] Delay or interruption in medical evaluation due to technological equipment failure or disruption; . Information transmitted may not be sufficient (e.g. poor resolution of images) to allow for appropriate medical decision making by the  Practitioner; and/or  . In rare instances, security protocols could fail, causing a breach of personal health information.  Furthermore, I acknowledge that it is my responsibility to provide information about my medical history, conditions and care that is complete and accurate to the best of my ability. I acknowledge that Practitioner's advice, recommendations, and/or decision may be based on factors not within their control, such as incomplete or inaccurate data provided by me or distortions of diagnostic images or specimens that may result from electronic transmissions. I understand that the practice of medicine is not an exact science and that Practitioner makes no warranties or guarantees regarding treatment outcomes. I acknowledge that a copy of this consent can be made available to me via my patient portal ([facility]), or I can request a printed copy by calling the office of [facility].    I understand that my insurance will be billed for this visit.   I have read or had this consent read to me. . I understand the contents of this consent, which adequately explains the benefits and risks of the Services being provided via telemedicine.  . I have been provided ample opportunity to ask questions regarding this consent and the Services and have had my questions answered to my satisfaction. . I give my informed consent for the services to be provided through the use of telemedicine in my medical care
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
## 2019-09-29 ENCOUNTER — Encounter: Payer: Self-pay | Admitting: Family Medicine

## 2019-09-29 ENCOUNTER — Telehealth (INDEPENDENT_AMBULATORY_CARE_PROVIDER_SITE_OTHER): Payer: BC Managed Care – PPO | Admitting: Family Medicine

## 2019-09-29 DIAGNOSIS — I10 Essential (primary) hypertension: Secondary | ICD-10-CM

## 2019-09-29 DIAGNOSIS — R002 Palpitations: Secondary | ICD-10-CM

## 2019-09-29 DIAGNOSIS — G4733 Obstructive sleep apnea (adult) (pediatric): Secondary | ICD-10-CM

## 2019-09-29 MED ORDER — METOPROLOL TARTRATE 25 MG PO TABS
25.0000 mg | ORAL_TABLET | Freq: Two times a day (BID) | ORAL | 6 refills | Status: DC
Start: 2019-09-29 — End: 2020-02-26

## 2019-09-29 NOTE — Patient Instructions (Addendum)
Medication Instructions:   Stop Toprol XL (Metroprolol Succ) - long acting.   Begin Lopressor (Metoprolol Tart) - short acting at 25mg  twice a day, may take an extra 1/2 tab (12.5mg ) as needed for palpitations.   Continue all other medications.    Labwork: none  Testing/Procedures: none  Follow-Up: 6 months   Any Other Special Instructions Will Be Listed Below (If Applicable).  If you need a refill on your cardiac medications before your next appointment, please call your pharmacy.
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
## 2019-11-14 ENCOUNTER — Other Ambulatory Visit: Payer: Self-pay

## 2019-11-14 ENCOUNTER — Encounter: Payer: Self-pay | Admitting: *Deleted

## 2019-11-14 ENCOUNTER — Ambulatory Visit (INDEPENDENT_AMBULATORY_CARE_PROVIDER_SITE_OTHER): Payer: BC Managed Care – PPO | Admitting: Gastroenterology

## 2019-11-14 ENCOUNTER — Encounter: Payer: Self-pay | Admitting: Gastroenterology

## 2019-11-14 VITALS — BP 162/86 | HR 78 | Temp 97.3°F | Ht 63.0 in | Wt 305.0 lb

## 2019-11-14 DIAGNOSIS — K219 Gastro-esophageal reflux disease without esophagitis: Secondary | ICD-10-CM | POA: Diagnosis not present

## 2019-11-14 DIAGNOSIS — R194 Change in bowel habit: Secondary | ICD-10-CM | POA: Diagnosis not present

## 2019-11-14 DIAGNOSIS — R1010 Upper abdominal pain, unspecified: Secondary | ICD-10-CM

## 2019-11-14 DIAGNOSIS — K625 Hemorrhage of anus and rectum: Secondary | ICD-10-CM | POA: Diagnosis not present

## 2019-11-14 MED ORDER — HYDROCORTISONE (PERIANAL) 2.5 % EX CREA: 1.0000 "application " | TOPICAL_CREAM | Freq: Two times a day (BID) | CUTANEOUS | 1 refills | Status: DC

## 2019-11-14 NOTE — Assessment & Plan Note (Signed)
49 year old female with few year history of postprandial epigastric abdominal pain lasting about 1 hour.  Occurs with all foods but worse with fried foods.  No significant N/V.  History of GERD but symptoms are fairly well controlled on Protonix twice daily.  EGD in 2019 for the same symptoms with small hiatal hernia, otherwise normal exam.  Prior ultrasound in 2018 also for upper abdominal pain with gallbladder grossly normal but visualization limited.  Abdominal exam is benign today.  Query whether patient may have component of biliary dyskinesia.  Plan: HIDA scan to evaluate gallbladder function. Continue Protonix 40 mg twice daily. Counseled on GERD diet/lifestyle.  Information was provided.

## 2019-11-14 NOTE — Assessment & Plan Note (Addendum)
Chronic.  Fairly well controlled on Protonix 40 mg twice daily.  Breakthrough symptoms maybe once a week in the setting of known dietary triggers.  No dysphagia.  Advise she continue her current medications.  Also counseled on GERD diet/lifestyle and information was provided.

## 2019-11-14 NOTE — Patient Instructions (Addendum)
Please have HIDA scan completed.   We will get you scheduled for a colonoscopy in the near future with Dr. Gala Romney.   Start benefiber or metamucil daily.   You may also add MiraLAX 1 capful (17g) daily in 8 oz of water.   Apply Anusol cream to your rectum twice daily for the next 7-10 days for possible hemorrhoids.    Try to avoid straining.   Limit toilet time to 2-3 minutes.   Continue Protonix 40 mg BID.   Follow a GERD diet:  Avoid fried, fatty, greasy, spicy, citrus foods. Avoid caffeine and carbonated beverages. Avoid chocolate. Try eating 4-6 small meals a day rather than 3 large meals. Do not eat within 3 hours of laying down. Prop head of bed up on wood or bricks to create a 6 inch incline.  We will see you back after your procedure. Do not hesitate to call with questions or concerns prior.   Aliene Altes, PA-C [facility]

## 2019-11-14 NOTE — Assessment & Plan Note (Signed)
Addressed under rectal bleeding 

## 2019-11-14 NOTE — Assessment & Plan Note (Addendum)
49 year old female with new onset of rectal bleeding x1 week.  She has had 2 separate occurrences of bright red blood per rectum with blood on toilet tissue and in the stool.  This is also in the setting of change in bowel habits from diarrhea followed by few days with no bowel movement, now straining to have BMs every other day that can be thin in caliber.  Mild rectal discomfort when straining.  No known hemorrhoids.  She declined rectal exam today.  Last colonoscopy November 2019 entirely normal.  I suspect rectal bleeding is likely secondary to benign anorectal source such as hemorrhoids in the setting of straining.  However, with change in bowel habits and new onset of rectal bleeding with colonoscopy about 2 years ago, cannot rule out colon polyps or malignancy.  Plan: Add Benefiber or Metamucil daily. May also add MiraLAX 17 g daily if needed. Anusol rectal cream twice daily for 7-10 days for empiric hemorrhoid treatment. Avoid straining as much as possible. Limit toilet time to 2-3 minutes. Proceed with colonoscopy with propofol with Dr. Gala Romney in the near future. The risks, benefits, and alternatives have been discussed with the patient in detail. The patient states understanding and desires to proceed.  ASA III Follow-up after colonoscopy.

## 2019-11-20 ENCOUNTER — Telehealth: Payer: Self-pay | Admitting: *Deleted

## 2019-11-20 NOTE — Telephone Encounter (Signed)
Called pt. She has been scheduled for TCS with propofol, Dr. Gala Romney, ASA 3 on 1/10 at 1:30pm. Aware will mail prep instructions with pre-op/covid test appt. Confirmed mailing address.

## 2019-11-21 ENCOUNTER — Encounter: Payer: Self-pay | Admitting: *Deleted

## 2019-11-25 ENCOUNTER — Telehealth: Payer: Self-pay | Admitting: *Deleted

## 2019-11-25 ENCOUNTER — Encounter (HOSPITAL_COMMUNITY): Payer: BC Managed Care – PPO

## 2019-11-25 NOTE — Telephone Encounter (Signed)
Called pt and LMOVM to see if patient wanted to move procedure up to 12/13 at 10:30am. Patient is on my cancellation list.

## 2019-11-25 NOTE — Telephone Encounter (Signed)
Patient returned call. She was agreeable to moving procedure up to 12/13 at 10:30am. Aware will mail new instructions with new pre-op/covid test appt. Called endo and also made aware.   PA for TCS approved via [employer].  Order ID: [id number]  Valid Dates: 11/25/2019 - 01/23/2020

## 2019-12-01 DIAGNOSIS — U071 COVID-19: Secondary | ICD-10-CM

## 2019-12-01 HISTORY — DX: COVID-19: U07.1

## 2019-12-02 ENCOUNTER — Encounter (HOSPITAL_COMMUNITY): Admission: RE | Admit: 2019-12-02 | Payer: BC Managed Care – PPO | Source: Ambulatory Visit

## 2019-12-15 ENCOUNTER — Ambulatory Visit: Payer: BC Managed Care – PPO | Admitting: Gastroenterology

## 2019-12-29 ENCOUNTER — Other Ambulatory Visit: Payer: Self-pay | Admitting: Unknown Physician Specialty

## 2019-12-29 ENCOUNTER — Other Ambulatory Visit: Payer: Self-pay | Admitting: Nurse Practitioner

## 2019-12-29 ENCOUNTER — Telehealth (HOSPITAL_COMMUNITY): Payer: Self-pay | Admitting: Emergency Medicine

## 2019-12-29 ENCOUNTER — Telehealth: Payer: Self-pay | Admitting: Unknown Physician Specialty

## 2019-12-29 DIAGNOSIS — I272 Pulmonary hypertension, unspecified: Secondary | ICD-10-CM

## 2019-12-29 DIAGNOSIS — U071 COVID-19: Secondary | ICD-10-CM

## 2019-12-29 NOTE — Progress Notes (Signed)
I connected by phone with Rachel Holland on 12/29/2019 at 5:09 PM to discuss the potential use of a new treatment for mild to moderate COVID-19 viral infection in non-hospitalized patients.  This patient is a 49 y.o. female that meets the FDA criteria for Emergency Use Authorization of COVID monoclonal antibody casirivimab/imdevimab, bamlanivimab/eteseviamb, or sotrovimab.  Has a (+) direct SARS-CoV-2 viral test result  Has mild or moderate COVID-19   Is NOT hospitalized due to COVID-19  Is within 10 days of symptom onset  Has at least one of the high risk factor(s) for progression to severe COVID-19 and/or hospitalization as defined in EUA.  Specific high risk criteria : BMI > 25 and Cardiovascular disease or hypertension   I have spoken and communicated the following to the patient or parent/caregiver regarding COVID monoclonal antibody treatment:  1. FDA has authorized the emergency use for the treatment of mild to moderate COVID-19 in adults and pediatric patients with positive results of direct SARS-CoV-2 viral testing who are 60 years of age and older weighing at least 40 kg, and who are at high risk for progressing to severe COVID-19 and/or hospitalization.  2. The significant known and potential risks and benefits of COVID monoclonal antibody, and the extent to which such potential risks and benefits are unknown.  3. Information on available alternative treatments and the risks and benefits of those alternatives, including clinical trials.  4. Patients treated with COVID monoclonal antibody should continue to self-isolate and use infection control measures (e.g., wear mask, isolate, social distance, avoid sharing personal items, clean and disinfect high touch surfaces, and frequent handwashing) according to CDC guidelines.   5. The patient or parent/caregiver has the option to accept or refuse COVID monoclonal antibody treatment.  After reviewing this information with the  patient, the patient has agreed to receive one of the available covid 19 monoclonal antibodies and will be provided an appropriate fact sheet prior to infusion. Jobe Gibbon, NP 12/29/2019 5:09 PM

## 2019-12-29 NOTE — Telephone Encounter (Signed)
I connected by phone with Rachel Holland on 12/29/2019 at 4:57 PM to discuss the potential use of a new treatment for mild to moderate COVID-19 viral infection in non-hospitalized patients.  This patient is a 49 y.o. female that meets the FDA criteria for Emergency Use Authorization of COVID monoclonal antibody casirivimab/imdevimab, bamlanivimab/eteseviamb, or sotrovimab.  Has a (+) direct SARS-CoV-2 viral test result  Has mild or moderate COVID-19   Is NOT hospitalized due to COVID-19  Is within 10 days of symptom onset  Has at least one of the high risk factor(s) for progression to severe COVID-19 and/or hospitalization as defined in EUA.  Specific high risk criteria : BMI > 25 and Cardiovascular disease or hypertension   I have spoken and communicated the following to the patient or parent/caregiver regarding COVID monoclonal antibody treatment:  1. FDA has authorized the emergency use for the treatment of mild to moderate COVID-19 in adults and pediatric patients with positive results of direct SARS-CoV-2 viral testing who are 54 years of age and older weighing at least 40 kg, and who are at high risk for progressing to severe COVID-19 and/or hospitalization.  2. The significant known and potential risks and benefits of COVID monoclonal antibody, and the extent to which such potential risks and benefits are unknown.  3. Information on available alternative treatments and the risks and benefits of those alternatives, including clinical trials.  4. Patients treated with COVID monoclonal antibody should continue to self-isolate and use infection control measures (e.g., wear mask, isolate, social distance, avoid sharing personal items, clean and disinfect "high touch" surfaces, and frequent handwashing) according to CDC guidelines.   5. The patient or parent/caregiver has the option to accept or refuse COVID monoclonal antibody treatment.  After reviewing this information with the  patient, the patient has agreed to receive one of the available covid 19 monoclonal antibodies and will be provided an appropriate fact sheet prior to infusion. Kathrine Haddock, NP 12/29/2019 4:57 PM  Sx onset 11/27

## 2019-12-29 NOTE — Telephone Encounter (Signed)
Called pt and explained possible monoclonal antibody treatment. Sx started 11/27. Tested 11/29 at [facility] in [place]. Waiting on test results. Her spouse tested positive 11/27. Sx include cough, congestion, fatigue, and sore throat. Qualifying risk factors include morbid obesity, pulmonary HTN, and obstructive sleep apnea. Fully vaccinated with [place] 11/27/2019. Pt interested in tx. Informed pt an APP will call back to possibly schedule an appointment.

## 2019-12-31 ENCOUNTER — Ambulatory Visit (HOSPITAL_COMMUNITY): Payer: BC Managed Care – PPO | Attending: Pulmonary Disease

## 2020-01-04 ENCOUNTER — Ambulatory Visit: Payer: Self-pay

## 2020-01-05 NOTE — Patient Instructions (Signed)
Rachel Holland  01/05/2020     @PREFPERIOPPHARMACY @   Your procedure is scheduled on  01/12/2020.  Report to [facility] at  0900  A.M.  Call this number if you have problems the morning of surgery:  [phone]   Remember:  Follow the diet and prep instructions given to you by the office.                      Take these medicines the morning of surgery with A SIP OF WATER  Clonazepam(if needed), metoprolol, protonix, zoloft.    Do not wear jewelry, make-up or nail polish.  Do not wear lotions, powders, or perfumes. Please wear deodorant and brush your teeth.  Do not shave 48 hours prior to surgery.  Men may shave face and neck.  Do not bring valuables to the hospital.  [facility] is not responsible for any belongings or valuables.  Contacts, dentures or bridgework may not be worn into surgery.  Leave your suitcase in the car.  After surgery it may be brought to your room.  For patients admitted to the hospital, discharge time will be determined by your treatment team.  Patients discharged the day of surgery will not be allowed to drive home.   Name and phone number of your driver:   family Special instructions:  DO NOT smoke the morning of your procedure.  Please read over the following fact sheets that you were given. Anesthesia Post-op Instructions and Care and Recovery After Surgery       Colonoscopy, Adult, Care After This sheet gives you information about how to care for yourself after your procedure. Your health care provider may also give you more specific instructions. If you have problems or questions, contact your health care provider. What can I expect after the procedure? After the procedure, it is common to have:  A small amount of blood in your stool for 24 hours after the procedure.  Some gas.  Mild cramping or bloating of your abdomen. Follow these instructions at home: Eating and drinking   Drink enough fluid to keep your urine pale  yellow.  Follow instructions from your health care provider about eating or drinking restrictions.  Resume your normal diet as instructed by your health care provider. Avoid heavy or fried foods that are hard to digest. Activity  Rest as told by your health care provider.  Avoid sitting for a long time without moving. Get up to take short walks every 1-2 hours. This is important to improve blood flow and breathing. Ask for help if you feel weak or unsteady.  Return to your normal activities as told by your health care provider. Ask your health care provider what activities are safe for you. Managing cramping and bloating   Try walking around when you have cramps or feel bloated.  Apply heat to your abdomen as told by your health care provider. Use the heat source that your health care provider recommends, such as a moist heat pack or a heating pad. ? Place a towel between your skin and the heat source. ? Leave the heat on for 20-30 minutes. ? Remove the heat if your skin turns bright red. This is especially important if you are unable to feel pain, heat, or cold. You may have a greater risk of getting burned. General instructions  For the first 24 hours after the procedure: ? Do not drive or use machinery. ? Do  not sign important documents. ? Do not drink alcohol. ? Do your regular daily activities at a slower pace than normal. ? Eat soft foods that are easy to digest.  Take over-the-counter and prescription medicines only as told by your health care provider.  Keep all follow-up visits as told by your health care provider. This is important. Contact a health care provider if:  You have blood in your stool 2-3 days after the procedure. Get help right away if you have:  More than a small spotting of blood in your stool.  Large blood clots in your stool.  Swelling of your abdomen.  Nausea or vomiting.  A fever.  Increasing pain in your abdomen that is not relieved with  medicine. Summary  After the procedure, it is common to have a small amount of blood in your stool. You may also have mild cramping and bloating of your abdomen.  For the first 24 hours after the procedure, do not drive or use machinery, sign important documents, or drink alcohol.  Get help right away if you have a lot of blood in your stool, nausea or vomiting, a fever, or increased pain in your abdomen. This information is not intended to replace advice given to you by your health care provider. Make sure you discuss any questions you have with your health care provider. Document Revised: 08/12/2018 Document Reviewed: 08/12/2018 Elsevier Patient Education  [place] After These instructions provide you with information about caring for yourself after your procedure. Your health care provider may also give you more specific instructions. Your treatment has been planned according to current medical practices, but problems sometimes occur. Call your health care provider if you have any problems or questions after your procedure. What can I expect after the procedure? After your procedure, you may:  Feel sleepy for several hours.  Feel clumsy and have poor balance for several hours.  Feel forgetful about what happened after the procedure.  Have poor judgment for several hours.  Feel nauseous or vomit.  Have a sore throat if you had a breathing tube during the procedure. Follow these instructions at home: For at least 24 hours after the procedure:      Have a responsible adult stay with you. It is important to have someone help care for you until you are awake and alert.  Rest as needed.  Do not: ? Participate in activities in which you could fall or become injured. ? Drive. ? Use heavy machinery. ? Drink alcohol. ? Take sleeping pills or medicines that cause drowsiness. ? Make important decisions or sign legal documents. ? Take care  of children on your own. Eating and drinking  Follow the diet that is recommended by your health care provider.  If you vomit, drink water, juice, or soup when you can drink without vomiting.  Make sure you have little or no nausea before eating solid foods. General instructions  Take over-the-counter and prescription medicines only as told by your health care provider.  If you have sleep apnea, surgery and certain medicines can increase your risk for breathing problems. Follow instructions from your health care provider about wearing your sleep device: ? Anytime you are sleeping, including during daytime naps. ? While taking prescription pain medicines, sleeping medicines, or medicines that make you drowsy.  If you smoke, do not smoke without supervision.  Keep all follow-up visits as told by your health care provider. This is important. Contact a health care  provider if:  You keep feeling nauseous or you keep vomiting.  You feel light-headed.  You develop a rash.  You have a fever. Get help right away if:  You have trouble breathing. Summary  For several hours after your procedure, you may feel sleepy and have poor judgment.  Have a responsible adult stay with you for at least 24 hours or until you are awake and alert. This information is not intended to replace advice given to you by your health care provider. Make sure you discuss any questions you have with your health care provider. Document Revised: 04/16/2017 Document Reviewed: 05/09/2015 Elsevier Patient Education  [place].

## 2020-01-08 ENCOUNTER — Encounter (HOSPITAL_COMMUNITY)
Admission: RE | Admit: 2020-01-08 | Discharge: 2020-01-08 | Disposition: A | Discharge status: Home / Self Care | Payer: BC Managed Care – PPO | Source: Ambulatory Visit | Attending: Internal Medicine | Admitting: Internal Medicine

## 2020-01-08 ENCOUNTER — Other Ambulatory Visit (HOSPITAL_COMMUNITY): Admission: RE | Admit: 2020-01-08 | Payer: BC Managed Care – PPO | Source: Ambulatory Visit

## 2020-01-08 ENCOUNTER — Encounter (HOSPITAL_COMMUNITY): Payer: Self-pay

## 2020-01-08 ENCOUNTER — Encounter: Payer: Self-pay | Admitting: *Deleted

## 2020-01-08 ENCOUNTER — Telehealth: Payer: Self-pay | Admitting: *Deleted

## 2020-01-08 NOTE — Telephone Encounter (Signed)
Received call from [facility] in short stay. Patient diagnosed with covid 11/29. Needs to r/s procedure.  Called pt and she has been rescheduled to 2/14 at 2:30pm. Aware will mail new instructions to her. Called endo and LMOVM making aware of appt change and + COVID test

## 2020-02-03 ENCOUNTER — Telehealth: Payer: Self-pay | Admitting: *Deleted

## 2020-02-03 NOTE — Telephone Encounter (Signed)
PA approved via [employer]. Auth# [id number] DOS 02/03/2020-04/02/2020

## 2020-02-06 ENCOUNTER — Ambulatory Visit (INDEPENDENT_AMBULATORY_CARE_PROVIDER_SITE_OTHER): Payer: BC Managed Care – PPO

## 2020-02-06 ENCOUNTER — Encounter: Payer: Self-pay | Admitting: Cardiology

## 2020-02-06 ENCOUNTER — Other Ambulatory Visit: Payer: Self-pay

## 2020-02-06 ENCOUNTER — Other Ambulatory Visit (HOSPITAL_COMMUNITY): Payer: BC Managed Care – PPO

## 2020-02-06 ENCOUNTER — Ambulatory Visit (INDEPENDENT_AMBULATORY_CARE_PROVIDER_SITE_OTHER): Payer: BC Managed Care – PPO | Admitting: Cardiology

## 2020-02-06 ENCOUNTER — Other Ambulatory Visit: Payer: Self-pay | Admitting: Cardiology

## 2020-02-06 VITALS — BP 130/78 | HR 88 | Ht 62.0 in | Wt 298.0 lb

## 2020-02-06 DIAGNOSIS — R002 Palpitations: Secondary | ICD-10-CM
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
## 2020-02-06 NOTE — Patient Instructions (Signed)
Medication Instructions:  Your physician recommends that you continue on your current medications as directed. Please refer to the Current Medication list given to you today.  *If you need a refill on your cardiac medications before your next appointment, please call your pharmacy*   Lab Work: NONE   If you have labs (blood work) drawn today and your tests are completely normal, you will receive your results only by: [name] MyChart Message (if you have MyChart) OR . A paper copy in the mail If you have any lab test that is abnormal or we need to change your treatment, we will call you to review the results.   Testing/Procedures: [place] [place]- Long Term Monitor Instructions   Your physician has requested you wear your ZIO patch monitor____2___days.   This is a single patch monitor.  [employer] supplies one patch monitor per enrollment.  Additional stickers are not available.   Please do not apply patch if you will be having a Nuclear Stress Test, Echocardiogram, Cardiac CT, MRI, or Chest Xray during the time frame you would be wearing the monitor. The patch cannot be worn during these tests.  You cannot remove and re-apply the ZIO XT patch monitor.   Your ZIO patch monitor will be sent USPS Priority mail from [facility] directly to your home address. The monitor may also be mailed to a PO BOX if home delivery is not available.   It may take 3-5 days to receive your monitor after you have been enrolled.   Once you have received you monitor, please review enclosed instructions.  Your monitor has already been registered assigning a specific monitor serial # to you.       Do not shower for the first 24 hours.  You may shower after the first 24 hours.   Press button if you feel a symptom. You will hear a small click.  Record Date, Time and Symptom in the Patient Log Book.   When you are ready to remove patch, follow instructions on last 2 pages of Patient Log Book.  Stick patch monitor onto  last page of Patient Log Book.   Place Patient Log Book in [place] box.  Use locking tab on box and tape box closed securely.  The [employer] has [employer] on it.  Please place in mailbox as soon as possible.  Your physician should have your test results approximately 7 days after the monitor has been mailed back to [facility].   Call [place] at [phone] if you have questions regarding your ZIO XT patch monitor.  Call them immediately if you see an orange light blinking on your monitor.   If your monitor falls off in less than 4 days contact our Monitor department at [phone].  If your monitor becomes loose or falls off after 4 days call [employer] at [phone] for suggestions on securing your monitor.     Follow-Up: At [facility], you and your health needs are our priority.  As part of our continuing mission to provide you with exceptional heart care, we have created designated Provider Care Teams.  These Care Teams include your primary Cardiologist (physician) and Advanced Practice Providers (APPs -  Physician Assistants and Nurse Practitioners) who all work together to provide you with the care you need, when you need it.  We recommend signing up for the patient portal called "MyChart".  Sign up information is provided on this After Visit Summary.  MyChart is used to connect  with patients for Virtual Visits (Telemedicine).  Patients are able to view lab/test results, encounter notes, upcoming appointments, etc.  Non-urgent messages can be sent to your provider as well.   To learn more about what you can do with MyChart, go to [email or web address].    Your next appointment:   4 month(s)  The format for your next appointment:   In Person  Provider:   You may see Dr. Harl Bowie  or one of the following Advanced Practice Providers on your designated Care Team:    Bernerd Pho, PA-C   Ermalinda Barrios, PA-C     Other  Instructions Thank you for choosing [facility]!

## 2020-02-26 ENCOUNTER — Telehealth: Payer: Self-pay | Admitting: Internal Medicine

## 2020-02-26 NOTE — Telephone Encounter (Signed)
Spoke with pt. Pt had an office visit 10/2019 due to seeing some blood after she had a BM. Pt states that since she has used her Anusol cream, she hasn't noticed anymore bleeding. Per pt, she saw blood once and got scared. Pt wanted to know if she could hold off on having TCS since blood hasn't been seen anymore? Please advise.

## 2020-02-26 NOTE — Telephone Encounter (Signed)
Recommend proceeding with colonoscopy to evaluate rectal bleeding and change in bowel habits. However, it is up to her whether she proceeds at this time. If she doesn't want to have colonoscopy, she can call back when she is ready to schedule. Otherwise, we can arrange follow-up in April if she would like.

## 2020-02-26 NOTE — Telephone Encounter (Signed)
Spoke with pt. Pt was notified of Rachel Holland, [place] recommendations. Pt would like to cancel the TCS at this time. Pt is coming in for a follow up on 05/06/2020 and may consider TCS if needed at that time.

## 2020-02-26 NOTE — Telephone Encounter (Signed)
Called endo and made aware to cancel 

## 2020-02-26 NOTE — Telephone Encounter (Signed)
[phone]  Please call patient, she has questions about if she should have her procedure

## 2020-03-09 ENCOUNTER — Encounter (HOSPITAL_COMMUNITY): Payer: Self-pay

## 2020-03-09 ENCOUNTER — Other Ambulatory Visit (HOSPITAL_COMMUNITY): Admission: RE | Admit: 2020-03-09 | Payer: BC Managed Care – PPO | Source: Ambulatory Visit

## 2020-03-09 ENCOUNTER — Telehealth: Payer: Self-pay | Admitting: *Deleted

## 2020-03-09 NOTE — Telephone Encounter (Signed)
-----   Message from Arnoldo Lenis, MD sent at 03/08/2020 11:08 AM EST ----- Heart monitor looks fine. Just some occasional extra heart beats, this is considered benign but can cause symptoms of heart racing or fluttering. No worrisome heart rhythms noted. If symptoms remains bother some could increase her lopressor to 37.5mg  bid, if symptoms mild then can say with current  regimen   Zandra Abts MD

## 2020-03-09 NOTE — Telephone Encounter (Signed)
Pt voiced understanding - symptoms are not bothersome at this time and aware could increase if needed - pt will let us know if she needs new rx - will continue current dose for now

## 2020-03-15 ENCOUNTER — Encounter (HOSPITAL_COMMUNITY): Admission: RE | Payer: Self-pay | Source: Home / Self Care

## 2020-03-15 ENCOUNTER — Ambulatory Visit (HOSPITAL_COMMUNITY)
Admission: RE | Admit: 2020-03-15 | Payer: BC Managed Care – PPO | Source: Home / Self Care | Admitting: Internal Medicine

## 2020-03-15 SURGERY — COLONOSCOPY WITH PROPOFOL
Anesthesia: Monitor Anesthesia Care

## 2020-04-13 ENCOUNTER — Telehealth: Payer: Self-pay | Admitting: Family Medicine

## 2020-04-13 NOTE — Telephone Encounter (Signed)
Pt c/o palpitations/skipped beats/dizziness since d/c from [facility] ED on 3/14 says HR this morning was 127 but went down to 87 - denies chest pain/swelling/SOB - BP 141/92 HR 84 - has appt 3/18 with NP

## 2020-04-13 NOTE — Telephone Encounter (Signed)
Patient called stating that she was seen at [facility] ER on 04/12/2020 for palpitations. States that she feels tired. Appointment has been scheduled for 04/16/2020 with Katina Dung, NP

## 2020-04-13 NOTE — Telephone Encounter (Signed)
Pt says she hasn't taken extra 25 mg of metoprolol as needed - pt will try this ans still wants to keep appt for Friday

## 2020-04-13 NOTE — Telephone Encounter (Signed)
She is on metoprolol 25 mg p.o. twice daily.  It looks like the order states she can take an extra 25 mg as needed for palpitations.  Ask her if she did so when these palpitations started.  If not she needs to take the extra dose.  If this does not help she probably needs to come back for some medication adjustment.
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
## 2020-04-16 ENCOUNTER — Encounter: Payer: Self-pay | Admitting: Family Medicine

## 2020-04-16 ENCOUNTER — Ambulatory Visit (INDEPENDENT_AMBULATORY_CARE_PROVIDER_SITE_OTHER): Payer: BC Managed Care – PPO | Admitting: Family Medicine

## 2020-04-16 VITALS — BP 140/88 | HR 71 | Ht 62.0 in | Wt 302.8 lb

## 2020-04-16 DIAGNOSIS — R0602 Shortness of breath: Secondary | ICD-10-CM | POA: Diagnosis not present

## 2020-04-16 DIAGNOSIS — G4733 Obstructive sleep apnea (adult) (pediatric): Secondary | ICD-10-CM

## 2020-04-16 DIAGNOSIS — I10 Essential (primary) hypertension: Secondary | ICD-10-CM | POA: Diagnosis not present

## 2020-04-16 DIAGNOSIS — R0600 Dyspnea, unspecified: Secondary | ICD-10-CM

## 2020-04-16 DIAGNOSIS — R002 Palpitations: Secondary | ICD-10-CM | POA: Diagnosis not present

## 2020-04-16 DIAGNOSIS — R0609 Other forms of dyspnea: Secondary | ICD-10-CM

## 2020-04-16 NOTE — Patient Instructions (Signed)
Medication Instructions:  Continue all current medications.  Labwork: none  Testing/Procedures:  Your physician has requested that you have an echocardiogram. Echocardiography is a painless test that uses sound waves to create images of your heart. It provides your doctor with information about the size and shape of your heart and how well your heart's chambers and valves are working. This procedure takes approximately one hour. There are no restrictions for this procedure.  Office will contact with results via phone or letter.    Follow-Up: 4-6 weeks   Any Other Special Instructions Will Be Listed Below (If Applicable).  If you need a refill on your cardiac medications before your next appointment, please call your pharmacy.  

## 2020-04-21 ENCOUNTER — Ambulatory Visit (INDEPENDENT_AMBULATORY_CARE_PROVIDER_SITE_OTHER): Payer: BC Managed Care – PPO

## 2020-04-21 DIAGNOSIS — R0600 Dyspnea, unspecified: Secondary | ICD-10-CM

## 2020-04-21 DIAGNOSIS — R0602 Shortness of breath: Secondary | ICD-10-CM

## 2020-04-21 DIAGNOSIS — R0609 Other forms of dyspnea: Secondary | ICD-10-CM

## 2020-04-21 LAB — ECHOCARDIOGRAM COMPLETE
Area-P 1/2: 2.56 cm2
Calc EF: 67.7 %
MV M vel: 4.28 m/s
MV Peak grad: 73.4 mmHg
S' Lateral: 2.5 cm
Single Plane A2C EF: 68.4 %
Single Plane A4C EF: 63.7 %

## 2020-04-23 ENCOUNTER — Telehealth: Payer: Self-pay | Admitting: *Deleted

## 2020-04-23 NOTE — Telephone Encounter (Signed)
Laurine Blazer, LPN  1/66/0630 1:60 PM EDT Back to Top     Notified, copy to pcp.

## 2020-04-23 NOTE — Telephone Encounter (Signed)
-----   Message from Verta Ellen., NP sent at 04/22/2020  8:56 AM EDT ----- Please call the patient and let her know the pumping function of her heart is good.  The left ventricle is mildly muscular.  Her pulmonary pressure appears to be around 33 mmHg which is not different than the previous study.  Her pulmonary artery peak pressure on last study was 33 mmHg.  Has a mildly leaking valve on the left which is unchanged from the last echocardiogram.

## 2020-04-26 ENCOUNTER — Other Ambulatory Visit: Payer: Self-pay | Admitting: "Endocrinology

## 2020-04-26 ENCOUNTER — Telehealth: Payer: Self-pay | Admitting: "Endocrinology

## 2020-04-26 DIAGNOSIS — R7303 Prediabetes: Secondary | ICD-10-CM

## 2020-04-26 DIAGNOSIS — D3500 Benign neoplasm of unspecified adrenal gland: Secondary | ICD-10-CM

## 2020-04-26 NOTE — Telephone Encounter (Signed)
Done

## 2020-04-26 NOTE — Telephone Encounter (Signed)
Pt has not been seen since 2020 and is requesting an appt, does she need labs? If so can you please place orders.

## 2020-04-28 LAB — COMPREHENSIVE METABOLIC PANEL
ALT: 21 IU/L (ref 0–32)
AST: 19 IU/L (ref 0–40)
Albumin/Globulin Ratio: 1.6 (ref 1.2–2.2)
Albumin: 4.6 g/dL (ref 3.8–4.8)
Alkaline Phosphatase: 85 IU/L (ref 44–121)
BUN/Creatinine Ratio: 15 (ref 9–23)
BUN: 13 mg/dL (ref 6–24)
Bilirubin Total: 0.4 mg/dL (ref 0.0–1.2)
CO2: 27 mmol/L (ref 20–29)
Calcium: 9.6 mg/dL (ref 8.7–10.2)
Chloride: 99 mmol/L (ref 96–106)
Creatinine, Ser: 0.89 mg/dL (ref 0.57–1.00)
Globulin, Total: 2.9 g/dL (ref 1.5–4.5)
Glucose: 98 mg/dL (ref 65–99)
Potassium: 4.4 mmol/L (ref 3.5–5.2)
Sodium: 142 mmol/L (ref 134–144)
Total Protein: 7.5 g/dL (ref 6.0–8.5)
eGFR: 79 mL/min/{1.73_m2} (ref >59)

## 2020-04-28 LAB — LIPID PANEL
Chol/HDL Ratio: 3.7 ratio (ref 0.0–4.4)
Cholesterol, Total: 223 mg/dL — ABNORMAL HIGH (ref 100–199)
HDL: 60 mg/dL (ref >39)
LDL Chol Calc (NIH): 121 mg/dL — ABNORMAL HIGH (ref 0–99)
Triglycerides: 243 mg/dL — ABNORMAL HIGH (ref 0–149)
VLDL Cholesterol Cal: 42 mg/dL — ABNORMAL HIGH (ref 5–40)

## 2020-04-28 LAB — TSH: TSH: 1.2 u[IU]/mL (ref 0.450–4.500)

## 2020-04-28 LAB — T4, FREE: Free T4: 1.33 ng/dL (ref 0.82–1.77)

## 2020-04-29 ENCOUNTER — Encounter: Payer: Self-pay | Admitting: "Endocrinology

## 2020-04-29 ENCOUNTER — Ambulatory Visit (INDEPENDENT_AMBULATORY_CARE_PROVIDER_SITE_OTHER): Payer: BC Managed Care – PPO | Admitting: "Endocrinology

## 2020-04-29 ENCOUNTER — Other Ambulatory Visit: Payer: Self-pay

## 2020-04-29 VITALS — BP 134/84 | HR 72 | Ht 62.0 in | Wt 298.0 lb

## 2020-04-29 DIAGNOSIS — R7303 Prediabetes: Secondary | ICD-10-CM

## 2020-04-29 DIAGNOSIS — Z5320 Procedure and treatment not carried out because of patient's decision for unspecified reasons: Secondary | ICD-10-CM | POA: Diagnosis not present

## 2020-04-29 DIAGNOSIS — E782 Mixed hyperlipidemia: Secondary | ICD-10-CM | POA: Diagnosis not present

## 2020-04-29 DIAGNOSIS — D3502 Benign neoplasm of left adrenal gland: Secondary | ICD-10-CM | POA: Diagnosis not present
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
## 2020-05-03 ENCOUNTER — Ambulatory Visit (INDEPENDENT_AMBULATORY_CARE_PROVIDER_SITE_OTHER): Payer: BC Managed Care – PPO | Admitting: Family Medicine

## 2020-05-03 ENCOUNTER — Encounter: Payer: Self-pay | Admitting: Family Medicine

## 2020-05-03 ENCOUNTER — Other Ambulatory Visit: Payer: Self-pay

## 2020-05-03 VITALS — BP 132/82 | HR 74 | Ht 62.0 in | Wt 303.8 lb

## 2020-05-03 DIAGNOSIS — R0602 Shortness of breath: Secondary | ICD-10-CM | POA: Diagnosis not present

## 2020-05-03 DIAGNOSIS — I10 Essential (primary) hypertension: Secondary | ICD-10-CM | POA: Diagnosis not present

## 2020-05-03 DIAGNOSIS — R002 Palpitations: Secondary | ICD-10-CM | POA: Diagnosis not present

## 2020-05-03 DIAGNOSIS — E782 Mixed hyperlipidemia: Secondary | ICD-10-CM

## 2020-05-03 DIAGNOSIS — Z9989 Dependence on other enabling machines and devices: Secondary | ICD-10-CM

## 2020-05-03 DIAGNOSIS — G4733 Obstructive sleep apnea (adult) (pediatric): Secondary | ICD-10-CM

## 2020-05-03 MED ORDER — ATORVASTATIN CALCIUM 10 MG PO TABS: 10.0000 mg | ORAL_TABLET | Freq: Every day | ORAL | 6 refills | Status: DC

## 2020-05-03 NOTE — Patient Instructions (Signed)
Medication Instructions:   Begin Lipitor 10mg  daily.   Continue all other medications.    Labwork:  FLP, LFT - due in 8 weeks  Will mail reminder when time.   Testing/Procedures: none  Follow-Up: 6 months   Any Other Special Instructions Will Be Listed Below (If Applicable).  If you need a refill on your cardiac medications before your next appointment, please call your pharmacy.

## 2020-05-06 ENCOUNTER — Ambulatory Visit: Payer: BC Managed Care – PPO | Admitting: Gastroenterology

## 2020-05-11 ENCOUNTER — Encounter: Payer: Self-pay | Admitting: Gastroenterology

## 2020-05-11 ENCOUNTER — Ambulatory Visit (INDEPENDENT_AMBULATORY_CARE_PROVIDER_SITE_OTHER): Payer: BC Managed Care – PPO | Admitting: Nurse Practitioner

## 2020-05-11 ENCOUNTER — Other Ambulatory Visit: Payer: Self-pay

## 2020-05-11 ENCOUNTER — Encounter: Payer: Self-pay | Admitting: Nurse Practitioner

## 2020-05-11 VITALS — BP 156/87 | HR 80 | Temp 97.7°F | Ht 62.0 in | Wt 303.6 lb

## 2020-05-11 DIAGNOSIS — R197 Diarrhea, unspecified: Secondary | ICD-10-CM

## 2020-05-11 DIAGNOSIS — R194 Change in bowel habit: Secondary | ICD-10-CM

## 2020-05-11 DIAGNOSIS — R1010 Upper abdominal pain, unspecified: Secondary | ICD-10-CM | POA: Diagnosis not present

## 2020-05-11 DIAGNOSIS — K219 Gastro-esophageal reflux disease without esophagitis: Secondary | ICD-10-CM
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
## 2020-05-11 NOTE — Patient Instructions (Signed)
Your health issues we discussed today were:   GERD (reflux/heartburn): 1. Continue taking your acid blocker 2. Let us know if you have any worsening symptoms  Abdominal pain and rectal bleeding:  1. I am glad these are improved 2. Notify us if your abdominal pain or rectal bleeding come back  Diarrhea yellow in color: 1. Have your labs completed when you are able to 2. Call us in 2 weeks if you are having persistent discoloration of your stools at which point we can consider stool tests 3. Because of any worsening or severe symptoms  Overall I recommend:  1. Continue other current medications 2. Return for follow-up 3 months 3. Call us for any questions or concerns.   ---------------------------------------------------------------  I am glad you have gotten your COVID-19 vaccination!  Even though you are fully vaccinated you should continue to follow CDC and state/local guidelines.  ---------------------------------------------------------------   At [facility] we value your feedback. You may receive a survey about your visit today. Please share your experience as we strive to create trusting relationships with our patients to provide genuine, compassionate, quality care.  We appreciate your understanding and patience as we review any laboratory studies, imaging, and other diagnostic tests that are ordered as we care for you. Our office policy is 5 business days for review of these results, and any emergent or urgent results are addressed in a timely manner for your best interest. If you do not hear from our office in 1 week, please contact us.   We also encourage the use of MyChart, which contains your medical information for your review as well. If you are not enrolled in this feature, an access code is on this after visit summary for your convenience. Thank you for allowing [place] to be involved in your care.  It was great to see you today!  I hope you have a great  spring!!

## 2020-05-14 ENCOUNTER — Ambulatory Visit: Payer: BC Managed Care – PPO | Admitting: Family Medicine

## 2020-05-20 LAB — COMPREHENSIVE METABOLIC PANEL WITH GFR
AG Ratio: 1.5 (calc) (ref 1.0–2.5)
ALT: 15 U/L (ref 6–29)
AST: 14 U/L (ref 10–35)
Albumin: 3.9 g/dL (ref 3.6–5.1)
Alkaline phosphatase (APISO): 62 U/L (ref 37–153)
BUN: 12 mg/dL (ref 7–25)
CO2: 31 mmol/L (ref 20–32)
Calcium: 9.1 mg/dL (ref 8.6–10.4)
Chloride: 102 mmol/L (ref 98–110)
Creat: 0.79 mg/dL (ref 0.50–1.05)
Globulin: 2.6 g/dL (ref 1.9–3.7)
Glucose, Bld: 97 mg/dL (ref 65–139)
Potassium: 4.5 mmol/L (ref 3.5–5.3)
Sodium: 141 mmol/L (ref 135–146)
Total Bilirubin: 0.2 mg/dL (ref 0.2–1.2)
Total Protein: 6.5 g/dL (ref 6.1–8.1)

## 2020-05-20 LAB — CBC WITH DIFFERENTIAL/PLATELET
Absolute Monocytes: 780 {cells}/uL (ref 200–950)
Basophils Absolute: 72 {cells}/uL (ref 0–200)
Basophils Relative: 0.6 %
Eosinophils Absolute: 312 {cells}/uL (ref 15–500)
Eosinophils Relative: 2.6 %
HCT: 37.6 % (ref 35.0–45.0)
Hemoglobin: 12.6 g/dL (ref 11.7–15.5)
Lymphs Abs: 3000 {cells}/uL (ref 850–3900)
MCH: 30.1 pg (ref 27.0–33.0)
MCHC: 33.5 g/dL (ref 32.0–36.0)
MCV: 90 fL (ref 80.0–100.0)
MPV: 10.4 fL (ref 7.5–12.5)
Monocytes Relative: 6.5 %
Neutro Abs: 7836 {cells}/uL — ABNORMAL HIGH (ref 1500–7800)
Neutrophils Relative %: 65.3 %
Platelets: 303 Thousand/uL (ref 140–400)
RBC: 4.18 Million/uL (ref 3.80–5.10)
RDW: 13 % (ref 11.0–15.0)
Total Lymphocyte: 25 %
WBC: 12 Thousand/uL — ABNORMAL HIGH (ref 3.8–10.8)

## 2020-05-20 LAB — LIPASE: Lipase: 40 U/L (ref 7–60)

## 2020-05-24 ENCOUNTER — Telehealth: Payer: Self-pay | Admitting: Internal Medicine

## 2020-05-24 NOTE — Telephone Encounter (Signed)
Noted  

## 2020-05-24 NOTE — Telephone Encounter (Signed)
Please address with Randall Hiss tomorrow. We have 5 business days to address labs. From a brief review, CMP is normal, lipase normal, and CBC has mildly elevated white blood cell count. Further recommendations per Randall Hiss tomorrow.

## 2020-05-24 NOTE — Telephone Encounter (Signed)
Pt called inquiring about her lab results. Pt isn't feeling well with her stomach x 1 month Pt continues to have loose stool vs constipation. Please advise about results. Walden Field, NP is out of the office today.

## 2020-05-24 NOTE — Telephone Encounter (Signed)
PLEASE CALL PATIENT ABOUT HER LABS 

## 2020-05-25 NOTE — Telephone Encounter (Signed)
See result note.  
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
## 2020-05-31 ENCOUNTER — Ambulatory Visit: Payer: BC Managed Care – PPO | Admitting: Family Medicine

## 2020-05-31 DIAGNOSIS — E782 Mixed hyperlipidemia: Secondary | ICD-10-CM

## 2020-05-31 DIAGNOSIS — R002 Palpitations: Secondary | ICD-10-CM

## 2020-05-31 DIAGNOSIS — R0600 Dyspnea, unspecified: Secondary | ICD-10-CM

## 2020-05-31 DIAGNOSIS — I10 Essential (primary) hypertension: Secondary | ICD-10-CM

## 2020-05-31 DIAGNOSIS — G4733 Obstructive sleep apnea (adult) (pediatric): Secondary | ICD-10-CM

## 2020-06-02 ENCOUNTER — Telehealth: Payer: Self-pay | Admitting: Nurse Practitioner

## 2020-06-02 ENCOUNTER — Other Ambulatory Visit: Payer: Self-pay

## 2020-06-02 DIAGNOSIS — R197 Diarrhea, unspecified: Secondary | ICD-10-CM

## 2020-06-02 DIAGNOSIS — E559 Vitamin D deficiency, unspecified: Secondary | ICD-10-CM

## 2020-06-02 NOTE — Telephone Encounter (Signed)
Patient with persistent chronic diarrhea (see MyChart patient message). Will check celiac panel, fecal calprotectin, vitamin d level, and fecal fat for malabsorption.  Pending results can consider trial of pancreatic enzymes for EPI.  Please call the patient and inform her of recommended testing.

## 2020-06-02 NOTE — Telephone Encounter (Signed)
Spoke with the pt and advised of the above, mychart message, and recommendations for lab work. Pt agreed. I was advised to put in the mail to her to get done (we discussed which way was best to pick up or mail). Also instructed this pt that it has to be watery stools in order to be tested, do not take formed stools back to [facility] because they will not test it. Pt also agreed to this.

## 2020-06-02 NOTE — Progress Notes (Signed)
ERROR

## 2020-06-02 NOTE — Addendum Note (Signed)
Addended by: Gordy Levan, Waldron Gerry A on: 06/02/2020 12:37 PM   Modules accepted: Orders

## 2020-06-08 ENCOUNTER — Ambulatory Visit: Payer: BC Managed Care – PPO | Admitting: Cardiology
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
## 2020-06-10 ENCOUNTER — Ambulatory Visit (INDEPENDENT_AMBULATORY_CARE_PROVIDER_SITE_OTHER): Payer: BC Managed Care – PPO | Admitting: Family Medicine

## 2020-06-10 ENCOUNTER — Encounter: Payer: Self-pay | Admitting: Family Medicine

## 2020-06-10 VITALS — BP 118/78 | HR 76 | Ht 62.0 in | Wt 300.0 lb

## 2020-06-10 DIAGNOSIS — R0600 Dyspnea, unspecified: Secondary | ICD-10-CM | POA: Diagnosis not present

## 2020-06-10 DIAGNOSIS — I10 Essential (primary) hypertension: Secondary | ICD-10-CM

## 2020-06-10 DIAGNOSIS — Z9989 Dependence on other enabling machines and devices: Secondary | ICD-10-CM

## 2020-06-10 DIAGNOSIS — R002 Palpitations: Secondary | ICD-10-CM | POA: Diagnosis not present

## 2020-06-10 DIAGNOSIS — G4733 Obstructive sleep apnea (adult) (pediatric): Secondary | ICD-10-CM | POA: Diagnosis not present

## 2020-06-10 DIAGNOSIS — R0609 Other forms of dyspnea: Secondary | ICD-10-CM

## 2020-06-10 DIAGNOSIS — E782 Mixed hyperlipidemia: Secondary | ICD-10-CM

## 2020-06-10 MED ORDER — DILTIAZEM HCL 30 MG PO TABS: 30.0000 mg | ORAL_TABLET | Freq: Two times a day (BID) | ORAL | 6 refills | Status: DC | PRN

## 2020-06-10 NOTE — Patient Instructions (Signed)
Medication Instructions:   Begin Cardizem 30mg  as needed up to twice a day for onset of palpitations.   Continue all other medications.    Labwork: none  Testing/Procedures: none  Follow-Up: 1 month   Any Other Special Instructions Will Be Listed Below (If Applicable). You have been referred to:  Pulmonology   If you need a refill on your cardiac medications before your next appointment, please call your pharmacy.

## 2020-06-10 NOTE — Addendum Note (Signed)
Addended by: Laurine Blazer on: 06/10/2020 09:32 AM   Modules accepted: Orders

## 2020-06-25 ENCOUNTER — Encounter: Payer: Self-pay | Admitting: *Deleted

## 2020-06-25 ENCOUNTER — Other Ambulatory Visit: Payer: Self-pay | Admitting: *Deleted

## 2020-06-25 DIAGNOSIS — E782 Mixed hyperlipidemia: Secondary | ICD-10-CM

## 2020-07-06 LAB — CELIAC DISEASE COMPREHENSIVE PANEL WITH GLIADIN ANTIBODY(IGG)
(tTG) Ab, IgA: 2.7 U/mL
Immunoglobulin A: 268 mg/dL (ref 47–310)

## 2020-07-06 LAB — VITAMIN D 25 HYDROXY (VIT D DEFICIENCY, FRACTURES): Vit D, 25-Hydroxy: 34 ng/mL (ref 30–100)

## 2020-07-08 ENCOUNTER — Ambulatory Visit: Payer: BC Managed Care – PPO | Admitting: Family Medicine

## 2020-07-10 LAB — HEPATIC FUNCTION PANEL
ALT: 12 IU/L (ref 0–32)
AST: 12 IU/L (ref 0–40)
Albumin: 4.4 g/dL (ref 3.8–4.8)
Alkaline Phosphatase: 85 IU/L (ref 44–121)
Bilirubin Total: 0.3 mg/dL (ref 0.0–1.2)
Bilirubin, Direct: <0.1 mg/dL (ref 0.00–0.40)
Total Protein: 7.2 g/dL (ref 6.0–8.5)

## 2020-07-10 LAB — LIPID PANEL
Chol/HDL Ratio: 3.4 ratio (ref 0.0–4.4)
Cholesterol, Total: 215 mg/dL — ABNORMAL HIGH (ref 100–199)
HDL: 64 mg/dL (ref >39)
LDL Chol Calc (NIH): 107 mg/dL — ABNORMAL HIGH (ref 0–99)
Triglycerides: 259 mg/dL — ABNORMAL HIGH (ref 0–149)
VLDL Cholesterol Cal: 44 mg/dL — ABNORMAL HIGH (ref 5–40)

## 2020-07-14 ENCOUNTER — Other Ambulatory Visit: Payer: Self-pay | Admitting: *Deleted

## 2020-07-14 MED ORDER — ATORVASTATIN CALCIUM 20 MG PO TABS: 20.0000 mg | ORAL_TABLET | Freq: Every day | ORAL | 3 refills | Status: DC

## 2020-07-15 ENCOUNTER — Encounter: Payer: Self-pay | Admitting: Pulmonary Disease

## 2020-07-15 ENCOUNTER — Other Ambulatory Visit: Payer: Self-pay

## 2020-07-15 ENCOUNTER — Ambulatory Visit (INDEPENDENT_AMBULATORY_CARE_PROVIDER_SITE_OTHER): Payer: BC Managed Care – PPO | Admitting: Pulmonary Disease

## 2020-07-15 VITALS — BP 128/80 | HR 72 | Temp 97.4°F | Ht 62.0 in | Wt 295.0 lb

## 2020-07-15 DIAGNOSIS — G4721 Circadian rhythm sleep disorder, delayed sleep phase type: Secondary | ICD-10-CM

## 2020-07-15 DIAGNOSIS — E669 Obesity, unspecified: Secondary | ICD-10-CM | POA: Diagnosis not present

## 2020-07-15 DIAGNOSIS — G4730 Sleep apnea, unspecified: Secondary | ICD-10-CM | POA: Diagnosis not present

## 2020-07-15 DIAGNOSIS — G4733 Obstructive sleep apnea (adult) (pediatric): Secondary | ICD-10-CM

## 2020-07-15 NOTE — Progress Notes (Signed)
[facility]  Chief Complaint  Patient presents with   Consult    Sleep Consult-currently on cpap got thru [place]    Constitutional:  BP 128/80 (BP Location: Left Arm, Cuff Size: Normal)   Pulse 72   Temp (!) 97.4 F (36.3 C) (Temporal)   Ht 5\' 2"  (1.575 m)   Wt 275 lb (124.7 kg)   SpO2 98% Comment: Room air  BMI 50.30 kg/m   Past Medical History:  Anxiety, Depression, GERD, HTN, COVID 19 infection November 2021  Past Surgical History:  She  has a past surgical history that includes arm surgery (Right); Esophagogastroduodenoscopy (egd) with propofol (N/A, 02/26/2017); maloney dilation (N/A, 02/26/2017); and Colonoscopy with propofol (N/A, 12/10/2017).  Brief Summary:  Rachel Holland is a 50 y.o. female former smoker with obstructive sleep apnea.      Subjective:   She is followed by cardiology for dyspnea, and palpitations which have increased in frequency since she had COVID 19 infection in November 2021.  She was treated with monoclonal antibody.  Chest xray report from 04/13/20 showed cardiomegaly and mild vascular congestion.  Labs from 05/19/20 showed Hb 12.6, CO2 31.  She did her sleep study about 10 or 11 years ago.  She uses [employer] for CPAP supplies.  Her current machine is about 51 years old, and making some noises now.  She has full face mask and this is comfortable.  Prior to getting CPAP she would snore and stop breathing; she was a restless sleeper and would feel sleepy throughout the day.  She goes to bed at 930 pm, which is her families routine.  She doesn't feel sleepy until 11 pm, and lays in bed for about 1.5 hours before falling asleep.  She wakes up at 630 am, again as part of her families routine.  She still feels tired in the morning, and feels best when she is able to sleep until 8 am.  She will try to nap when she can to catch up on her sleep.  She is not using anything to help her sleep or stay awake.    She  denies sleep walking, sleep talking, bruxism, or nightmares.  There is no history of restless legs.  She denies sleep hallucinations, sleep paralysis, or cataplexy.  The Epworth score is 10 out of 24.  She is trying to live a more healthy lifestyle.  She has lost about 10 lbs in the past couple of months.  Physical Exam:   Appearance - well kempt   ENMT - no sinus tenderness, no oral exudate, no LAN, Mallampati 3 airway, no stridor, scalloped tongue  Respiratory - equal breath sounds bilaterally, no wheezing or rales  CV - s1s2 regular rate and rhythm, no murmurs  Ext - no clubbing, no edema  Skin - no rashes  Psych - normal mood and affect   Sleep Tests:  Auto CPAP 06/15/20 to 07/14/20 >> used on 30 of 30 nights with average 10 hrs 40 min.  Average AHI 0.7 with median CPAP 9 and 95 th percentile CPAP 13 cm H2O.  Cardiac Tests:  Echo 04/21/20 >> EF 65 to 70%, mild LVH, RVSP 33 mmHg  Social History:  She  reports that she quit smoking about 12 years ago. Her smoking use included cigarettes. She started smoking about 36 years ago. She has a 20.00 pack-year smoking history. She has never used smokeless tobacco. She reports that she does not drink alcohol and does  not use drugs.  Family History:  Her family history includes Diverticulitis in her mother; Parkinson's disease in her father; Prostate cancer in her father.     Assessment/Plan:   Obstructive sleep apnea. - discussed how sleep apnea can impact her health - treatment options reviewed - she is compliant with CPAP and reports benefit from therapy - she uses [employer] for her DME - her current CPAP is more than 51 yrs old, not functioning properly and not amenable to repair - will arrange for new auto CPAP 5 to 12 cm H2O; she would prefer to wait until she can get a replacement Resmed machine - discussed options to assist with mask fit - will arrange for overnight oximetry on auto CPAP - will try to get a copy of her previous  sleep study from her PCP or her DME - explained that her insurance might require repeat sleep study prior to getting new CPAP machine; should be able to do a home sleep study if needed  Obesity. - encouraged her to keep up with her weight loss efforts  Delayed sleep phase syndrome. - advised her to avoid bright light sources too close to bed time - she can try melatonin 3 to 5 mg about 30 minutes before her desired bedtime - she should try to get natural light exposure early in the morning after waking up - she needs to avoid napping during the day - explained it can several weeks to entrain her circadian rhythm to phase shift ahead to her desired sleep schedule  Time Spent Involved in Patient Care on Day of Examination:  48 minutes  Follow up:   Patient Instructions  Will get a copy of your sleep study from Dr. Trena Platt office or from [place]  Will have [place] arrange for new Resmed auto CPAP with pressure range 5 to 12 cm water pressure  Will arrange for overnight oxygen test with you using CPAP   Try using melatonin 3 to 5 mg nightly about 30 minutes before your scheduled bedtime  Avoid sources of bright light close to bedtime, and try to get exposure to natural light early in the morning after waking up  Follow up in 4 months  Medication List:   Allergies as of 07/15/2020       Reactions   Sulfa Antibiotics Nausea And Vomiting        Medication List        Accurate as of July 15, 2020  9:43 AM. If you have any questions, ask your nurse or doctor.          atorvastatin 20 MG tablet Commonly known as: LIPITOR Take 1 tablet (20 mg total) by mouth daily.   clonazePAM 0.5 MG tablet Commonly known as: KLONOPIN Take 0.5 mg by mouth daily as needed for anxiety.   diltiazem 30 MG tablet Commonly known as: Cardizem Take 1 tablet (30 mg total) by mouth 2 (two) times daily as needed (palpitations).   estradiol 0.1 MG/GM vaginal cream Commonly known as: ESTRACE Place  1 Applicatorful vaginally 3 (three) times a week.   hydrochlorothiazide 12.5 MG tablet Commonly known as: HYDRODIURIL Take 12.5 mg by mouth daily.   hydroxypropyl methylcellulose / hypromellose 2.5 % ophthalmic solution Commonly known as: ISOPTO TEARS / GONIOVISC Place 1 drop into both eyes 3 (three) times daily as needed for dry eyes.   metoprolol tartrate 25 MG tablet Commonly known as: LOPRESSOR Take 25 mg by mouth 2 (two) times daily.   pantoprazole 40  MG tablet Commonly known as: PROTONIX Take 1 tablet (40 mg total) by mouth 2 (two) times daily before a meal.   sertraline 100 MG tablet Commonly known as: ZOLOFT Take 50 mg by mouth daily.   vitamin C 500 MG tablet Commonly known as: ASCORBIC ACID Take 500 mg by mouth daily as needed.   Vitamin D (Ergocalciferol) 1.25 MG (50000 UNIT) Caps capsule Commonly known as: DRISDOL Take 50,000 Units by mouth once a week.   zinc gluconate 50 MG tablet Take 50 mg by mouth daily as needed.        Signature:  Chesley Mires, MD [place] Pager - [phone] 07/15/2020, 9:43 AM

## 2020-07-15 NOTE — Patient Instructions (Signed)
Will get a copy of your sleep study from Dr. Trena Platt office or from [place]  Will have [place] arrange for new Resmed auto CPAP with pressure range 5 to 12 cm water pressure  Will arrange for overnight oxygen test with you using CPAP   Try using melatonin 3 to 5 mg nightly about 30 minutes before your scheduled bedtime  Avoid sources of bright light close to bedtime, and try to get exposure to natural light early in the morning after waking up  Follow up in 4 months

## 2020-08-04 ENCOUNTER — Telehealth: Payer: Self-pay | Admitting: Pulmonary Disease

## 2020-08-04 NOTE — Telephone Encounter (Signed)
Best to schedule a video visit to go into more details about plan of therapy.

## 2020-08-04 NOTE — Telephone Encounter (Signed)
Spoke with SG. Patient did not drop below 88% during study however 22 desaturation events were recorded and a home sleep study is recommended at this time. High was 99% and low was 90%.   LM to inform patient. Will await a call back.

## 2020-08-04 NOTE — Telephone Encounter (Signed)
Call returned to patient, confirmed DOB. Made aware of ONO results. Patient states she has been using [employer] for years. She reports she is not eligible for a new cpap machine as she tried to get a new machine during her last visit here. She reports she had a rough night the night of the test and wanted to know what this means moving forward. She reports she uses her cpap every night so she is wanting to know does she need to wear oxygen moving forward while on her cpap. Or does she need to repeat her sleep study to get a better idea. She is requesting to speak with provider or maybe set up an tele visit. I made her aware I would touch bases with her provider and get back with her. Voiced understanding.   VS please advise. Thanks :)

## 2020-08-04 NOTE — Telephone Encounter (Signed)
LM to call back with patient.   ONO results obtained from VS box upfront. Will take to DOD to see if they can review for recommendations. Results taken to [place]. Will leave message in triage. Will also route to SG for review and FYI.

## 2020-08-04 NOTE — Telephone Encounter (Signed)
Patient is returning phone call. Patient phone number is [phone].

## 2020-08-04 NOTE — Telephone Encounter (Signed)
Please call results noted in thread to the patient.

## 2020-08-04 NOTE — Telephone Encounter (Signed)
Left message to call back with patient

## 2020-08-05 NOTE — Telephone Encounter (Signed)
Called and spoke to pt. Informed her of the recs per VS. Dr. Juanetta Gosling first available isnt until mid August. Pt states she did not want to wait that long and requested to be seen by an APP. A video visit has been scheduled with TP for 8/1. Will forward to Dr. Halford Chessman and TP as Juluis Rainier.

## 2020-08-12 ENCOUNTER — Encounter: Payer: Self-pay | Admitting: Gastroenterology

## 2020-08-12 ENCOUNTER — Ambulatory Visit: Payer: BC Managed Care – PPO | Admitting: Gastroenterology

## 2020-08-23 DIAGNOSIS — G5600 Carpal tunnel syndrome, unspecified upper limb: Secondary | ICD-10-CM | POA: Insufficient documentation

## 2020-08-23 DIAGNOSIS — F419 Anxiety disorder, unspecified: Secondary | ICD-10-CM | POA: Insufficient documentation

## 2020-08-23 DIAGNOSIS — M5412 Radiculopathy, cervical region: Secondary | ICD-10-CM | POA: Insufficient documentation

## 2020-08-23 DIAGNOSIS — I10 Essential (primary) hypertension: Secondary | ICD-10-CM | POA: Insufficient documentation

## 2020-08-30 ENCOUNTER — Other Ambulatory Visit: Payer: Self-pay

## 2020-08-30 ENCOUNTER — Telehealth (INDEPENDENT_AMBULATORY_CARE_PROVIDER_SITE_OTHER): Payer: BC Managed Care – PPO | Admitting: Adult Health

## 2020-08-30 ENCOUNTER — Encounter: Payer: Self-pay | Admitting: Adult Health

## 2020-08-30 VITALS — Ht 62.0 in | Wt 295.0 lb

## 2020-08-30 DIAGNOSIS — G4733 Obstructive sleep apnea (adult) (pediatric): Secondary | ICD-10-CM | POA: Diagnosis not present

## 2020-08-30 NOTE — Progress Notes (Signed)
Reviewed and agree with assessment/plan.   Chesley Mires, MD [facility] 08/30/2020, 12:37 PM Pager:  [phone]

## 2020-08-30 NOTE — Patient Instructions (Signed)
Continue on CPAP at bedtime Keep up the good work Healthy weight loss Do not drive if sleepy.  Follow-up with Dr. Halford Chessman in 1 year and As needed

## 2020-08-30 NOTE — Progress Notes (Signed)
Virtual Visit via Video Note  I connected with Rachel Holland on 08/30/20 at 10:30 AM EDT by a video enabled telemedicine application and verified that I am speaking with the correct person using two identifiers.  Location: Patient: Home  Provider: Office    I discussed the limitations of evaluation and management by telemedicine and the availability of in person appointments. The patient expressed understanding and agreed to proceed.  History of Present Illness: 50 year old female seen for sleep consult July 15, 2020 to establish for sleep apnea. Today's video visit is a 6 -week follow-up.  Patient was seen last visit for a sleep consult to establish for sleep apnea.  Patient has had sleep apnea for several years.  Last visit she was ordered a new CPAP machine but is not up for renewal as of yet.  Her current CPAP machine is from 2019.  She will be up for renewal in 2024.  Patient says that she cannot live without her CPAP.  She wears it every single night.  If she takes a nap during the daytime she also wears it.  She feels that she does benefit from it and could not manage without her CPAP.  She does feel rested.  She does sometimes wake up with headaches.  An overnight oximetry test was completed on CPAP which showed no significant desaturations.  O2 saturations remained above 90% during her overnight oximetry test.  We discussed her overnight oximetry results.  CPAP download shows excellent compliance with 100% usage.  Daily average usage around 10 hours.  AHI 0.8. Patient says she is working on healthy weight loss.  BMI currently is 53.   Observations/Objective: Speaks in full sentences with no apparent distress.  Appears well  Assessment and Plan: Obstructive sleep apnea with excellent control compliance on nocturnal CPAP Overnight oximetry test with no nocturnal desaturations on CPAP-no need for oxygen with CPAP  Obesity continue with healthy weight loss  Plan  Patient  Instructions  Continue on CPAP at bedtime Keep up the good work Healthy weight loss Do not drive if sleepy.  Follow-up with Dr. Halford Chessman in 1 year and As needed      Follow Up Instructions: Follow-up in 1 year and as needed   I discussed the assessment and treatment plan with the patient. The patient was provided an opportunity to ask questions and all were answered. The patient agreed with the plan and demonstrated an understanding of the instructions.   The patient was advised to call back or seek an in-person evaluation if the symptoms worsen or if the condition fails to improve as anticipated.  I provided 22 minutes of non-face-to-face time during this encounter.   Rexene Edison, NP

## 2020-10-27 ENCOUNTER — Ambulatory Visit: Payer: BC Managed Care – PPO | Admitting: Cardiology
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
## 2020-10-28 ENCOUNTER — Encounter: Payer: Self-pay | Admitting: Cardiology

## 2020-11-03 ENCOUNTER — Encounter: Payer: Self-pay | Admitting: Family Medicine

## 2020-11-10 ENCOUNTER — Encounter: Payer: Self-pay | Admitting: *Deleted
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
## 2020-11-11 ENCOUNTER — Encounter: Payer: Self-pay | Admitting: *Deleted

## 2020-11-11 ENCOUNTER — Encounter: Payer: Self-pay | Admitting: Family Medicine

## 2020-11-11 ENCOUNTER — Ambulatory Visit (INDEPENDENT_AMBULATORY_CARE_PROVIDER_SITE_OTHER): Payer: Self-pay | Admitting: Family Medicine

## 2020-11-11 ENCOUNTER — Other Ambulatory Visit: Payer: Self-pay

## 2020-11-11 VITALS — BP 142/90 | HR 74 | Ht 62.0 in | Wt 285.6 lb

## 2020-11-11 DIAGNOSIS — I10 Essential (primary) hypertension: Secondary | ICD-10-CM

## 2020-11-11 DIAGNOSIS — G4733 Obstructive sleep apnea (adult) (pediatric): Secondary | ICD-10-CM

## 2020-11-11 DIAGNOSIS — I200 Unstable angina: Secondary | ICD-10-CM

## 2020-11-11 DIAGNOSIS — R079 Chest pain, unspecified: Secondary | ICD-10-CM

## 2020-11-11 DIAGNOSIS — R002 Palpitations: Secondary | ICD-10-CM

## 2020-11-11 DIAGNOSIS — R0609 Other forms of dyspnea: Secondary | ICD-10-CM

## 2020-11-11 MED ORDER — METOPROLOL TARTRATE 25 MG PO TABS
ORAL_TABLET | ORAL | 3 refills | Status: DC
Start: 2020-11-11 — End: 2021-10-14

## 2020-11-11 NOTE — Patient Instructions (Signed)
Medication Instructions:  Your physician has recommended you make the following change in your medication:  You may take an additional PRN Metoprolol as needed for Palpitations   *If you need a refill on your cardiac medications before your next appointment, please call your pharmacy*   Lab Work: None ordered  If you have labs (blood work) drawn today and your tests are completely normal, you will receive your results only by: [place] (if you have MyChart) OR A paper copy in the mail If you have any lab test that is abnormal or we need to change your treatment, we will call you to review the results.   Testing/Procedures: Your physician has requested that you have en exercise stress myoview. For further information please visit [email or web address]. Please follow instruction sheet, as given.    Follow-Up: At [facility], you and your health needs are our priority.  As part of our continuing mission to provide you with exceptional heart care, we have created designated Provider Care Teams.  These Care Teams include your primary Cardiologist (physician) and Advanced Practice Providers (APPs -  Physician Assistants and Nurse Practitioners) who all work together to provide you with the care you need, when you need it.  We recommend signing up for the patient portal called "MyChart".  Sign up information is provided on this After Visit Summary.  MyChart is used to connect with patients for Virtual Visits (Telemedicine).  Patients are able to view lab/test results, encounter notes, upcoming appointments, etc.  Non-urgent messages can be sent to your provider as well.   To learn more about what you can do with MyChart, go to [email or web address].    Your next appointment:   After Stress Test  The format for your next appointment:   In Person  Provider:   Katina Dung, NP   Other Instructions

## 2020-11-18 ENCOUNTER — Encounter (HOSPITAL_COMMUNITY): Payer: Medicaid Other

## 2020-11-18 ENCOUNTER — Encounter (HOSPITAL_COMMUNITY): Payer: Self-pay
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
## 2020-11-23 ENCOUNTER — Ambulatory Visit: Payer: Medicaid Other | Admitting: Family Medicine

## 2020-11-30 ENCOUNTER — Telehealth: Payer: Self-pay | Admitting: Internal Medicine

## 2020-11-30 NOTE — Telephone Encounter (Signed)
Please verify if this is a RMR patient or Dr Abbey Chatters patient. She has last seen Walden Field, NP. She called today saying her insurance has changed to [facility] and doesn't have her new cards yet. She is having problems getting her pantoprazole refilled and was told to call us and for [place] to call her insurance company. Please call her at [phone]

## 2020-11-30 NOTE — Telephone Encounter (Signed)
Spoke with the pt advised her to call the pharmacy and give them the insurance information because I don't know if she will need a PA or not for pantoprazole she is trying to get. (Pt's husband was laid off after 20 years on his job and they had to change ins.) pt is going to call the pharmacy to see if a PA will be needed. Her new insurance has to be put in from pharmacy to get Rx's). She expressed understanding.

## 2020-12-06 ENCOUNTER — Ambulatory Visit (HOSPITAL_COMMUNITY)
Admission: RE | Admit: 2020-12-06 | Discharge: 2020-12-06 | Disposition: A | Discharge status: Home / Self Care | Payer: BC Managed Care – PPO | Source: Ambulatory Visit | Attending: Family Medicine | Admitting: Family Medicine

## 2020-12-06 ENCOUNTER — Encounter (HOSPITAL_COMMUNITY)
Admission: RE | Admit: 2020-12-06 | Discharge: 2020-12-06 | Disposition: A | Discharge status: Home / Self Care | Payer: BC Managed Care – PPO | Source: Ambulatory Visit | Attending: Family Medicine | Admitting: Family Medicine

## 2020-12-06 ENCOUNTER — Other Ambulatory Visit: Payer: Self-pay

## 2020-12-06 ENCOUNTER — Encounter (HOSPITAL_COMMUNITY): Payer: Self-pay

## 2020-12-06 DIAGNOSIS — I200 Unstable angina: Secondary | ICD-10-CM | POA: Insufficient documentation
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
## 2020-12-09 ENCOUNTER — Ambulatory Visit: Payer: Medicaid Other | Admitting: Family Medicine

## 2021-02-04 ENCOUNTER — Other Ambulatory Visit: Payer: Self-pay

## 2021-02-04 ENCOUNTER — Encounter: Payer: Self-pay | Admitting: Urology

## 2021-02-04 ENCOUNTER — Ambulatory Visit (INDEPENDENT_AMBULATORY_CARE_PROVIDER_SITE_OTHER): Payer: BC Managed Care – PPO | Admitting: Urology

## 2021-02-04 VITALS — BP 140/80 | HR 75 | Ht 62.0 in | Wt 285.0 lb

## 2021-02-04 DIAGNOSIS — N390 Urinary tract infection, site not specified: Secondary | ICD-10-CM | POA: Insufficient documentation

## 2021-02-04 DIAGNOSIS — R310 Gross hematuria: Secondary | ICD-10-CM | POA: Diagnosis not present

## 2021-02-04 DIAGNOSIS — R8290 Unspecified abnormal findings in urine: Secondary | ICD-10-CM | POA: Diagnosis not present

## 2021-02-04 LAB — MICROSCOPIC EXAMINATION
Epithelial Cells (non renal): >10 /hpf — AB (ref 0–10)
Renal Epithel, UA: NONE SEEN /hpf

## 2021-02-04 LAB — URINALYSIS, ROUTINE W REFLEX MICROSCOPIC
Bilirubin, UA: NEGATIVE
Glucose, UA: NEGATIVE
Ketones, UA: NEGATIVE
Nitrite, UA: NEGATIVE
Protein,UA: NEGATIVE
Specific Gravity, UA: 1.015 (ref 1.005–1.030)
Urobilinogen, Ur: 0.2 mg/dL (ref 0.2–1.0)
pH, UA: 6 (ref 5.0–7.5)

## 2021-02-04 LAB — BLADDER SCAN AMB NON-IMAGING: Scan Result: 5

## 2021-02-04 NOTE — Progress Notes (Signed)
post void residual =5  Urological Symptom Review  Patient is experiencing the following symptoms: Frequent urination Burning/pain with urination Get up at night to urinate Leakage of urine Blood in urine Urinary tract infection Painful intercourse   Review of Systems  Gastrointestinal (upper)  : Indigestion/heartburn  Gastrointestinal (lower) : Negative for lower GI symptoms  Constitutional : Night Sweats Fatigue  Skin: Negative for skin symptoms  Eyes: Negative for eye symptoms  Ear/Nose/Throat : Negative for Ear/Nose/Throat symptoms  Hematologic/Lymphatic: Negative for Hematologic/Lymphatic symptoms  Cardiovascular : Negative for cardiovascular symptoms  Respiratory : Negative for respiratory symptoms  Endocrine: Negative for endocrine symptoms  Musculoskeletal: Back pain  Neurological: Negative for neurological symptoms  Psychologic: Negative for psychiatric symptoms
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
## 2021-02-06 LAB — URINE CULTURE

## 2021-02-10 ENCOUNTER — Ambulatory Visit: Payer: BC Managed Care – PPO | Admitting: Urology

## 2021-02-15 ENCOUNTER — Ambulatory Visit (INDEPENDENT_AMBULATORY_CARE_PROVIDER_SITE_OTHER): Payer: BC Managed Care – PPO | Admitting: Physician Assistant

## 2021-02-15 ENCOUNTER — Other Ambulatory Visit: Payer: Self-pay

## 2021-02-15 ENCOUNTER — Telehealth: Payer: Self-pay

## 2021-02-15 VITALS — BP 142/88 | HR 81

## 2021-02-15 DIAGNOSIS — N3001 Acute cystitis with hematuria: Secondary | ICD-10-CM | POA: Diagnosis not present

## 2021-02-15 DIAGNOSIS — R310 Gross hematuria: Secondary | ICD-10-CM

## 2021-02-15 LAB — URINALYSIS, ROUTINE W REFLEX MICROSCOPIC
Bilirubin, UA: NEGATIVE
Ketones, UA: NEGATIVE
Nitrite, UA: POSITIVE — AB
Protein,UA: NEGATIVE
Specific Gravity, UA: 1.01 (ref 1.005–1.030)
Urobilinogen, Ur: 0.2 mg/dL (ref 0.2–1.0)
pH, UA: 7 (ref 5.0–7.5)

## 2021-02-15 LAB — MICROSCOPIC EXAMINATION
Epithelial Cells (non renal): >10 /hpf — AB (ref 0–10)
RBC, Urine: >30 /hpf — AB (ref 0–2)
Renal Epithel, UA: NONE SEEN /hpf

## 2021-02-15 MED ORDER — NITROFURANTOIN MONOHYD MACRO 100 MG PO CAPS: 100.0000 mg | ORAL_CAPSULE | Freq: Two times a day (BID) | ORAL | 0 refills | Status: AC

## 2021-02-15 MED ORDER — PHENAZOPYRIDINE HCL 200 MG PO TABS: 200.0000 mg | ORAL_TABLET | Freq: Three times a day (TID) | ORAL | 0 refills | Status: DC
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
## 2021-02-15 NOTE — Telephone Encounter (Signed)
Patient called stating she woke up with vaginal pressure and noted blood in her urine. Patient placed on PA's schedule to be seen today.

## 2021-02-15 NOTE — Progress Notes (Signed)
Urological Symptom Review  Patient is experiencing the following symptoms: Frequent urination Hard to postpone urination Burning/pain with urination Get up at night to urinate Leakage of urine Blood in urine Painful intercourse   Review of Systems  Gastrointestinal (upper)  : Negative for upper GI symptoms  Gastrointestinal (lower) : Negative for lower GI symptoms  Constitutional : Negative for symptoms  Skin: Negative for skin symptoms  Eyes: Negative for eye symptoms  Ear/Nose/Throat : Negative for Ear/Nose/Throat symptoms  Hematologic/Lymphatic: Negative for Hematologic/Lymphatic symptoms  Cardiovascular : Negative for cardiovascular symptoms  Respiratory : Negative for respiratory symptoms  Endocrine: Negative for endocrine symptoms  Musculoskeletal: Back pain  Neurological: Negative for neurological symptoms  Psychologic: Depression Anxiety

## 2021-02-15 NOTE — Progress Notes (Signed)
Scheduled MDX pickup  # [id number] FOR [id number]

## 2021-02-17 ENCOUNTER — Ambulatory Visit (HOSPITAL_COMMUNITY)
Admission: RE | Admit: 2021-02-17 | Discharge: 2021-02-17 | Disposition: A | Discharge status: Home / Self Care | Payer: BC Managed Care – PPO | Source: Ambulatory Visit | Attending: Urology | Admitting: Urology

## 2021-02-17 ENCOUNTER — Other Ambulatory Visit: Payer: Self-pay

## 2021-02-17 ENCOUNTER — Encounter: Payer: Self-pay | Admitting: Urology

## 2021-02-17 ENCOUNTER — Other Ambulatory Visit: Payer: Self-pay | Admitting: Physician Assistant

## 2021-02-17 DIAGNOSIS — R310 Gross hematuria: Secondary | ICD-10-CM | POA: Diagnosis not present

## 2021-02-17 LAB — POCT I-STAT CREATININE: Creatinine, Ser: 1 mg/dL (ref 0.44–1.00)

## 2021-02-17 MED ORDER — IOHEXOL 300 MG/ML  SOLN
100.0000 mL | Freq: Once | INTRAMUSCULAR | Status: AC | PRN
  Administered 2021-02-17: 100 mL via INTRAVENOUS

## 2021-02-18 ENCOUNTER — Telehealth: Payer: Self-pay

## 2021-02-18 NOTE — Telephone Encounter (Signed)
Patient called office to verify urine results and ct results were back for MD to review.  Patient informed results were back and patient will keep scheduled office visit to see MD next weelk

## 2021-02-23 ENCOUNTER — Ambulatory Visit (INDEPENDENT_AMBULATORY_CARE_PROVIDER_SITE_OTHER): Payer: BC Managed Care – PPO | Admitting: Urology

## 2021-02-23 ENCOUNTER — Other Ambulatory Visit: Payer: BC Managed Care – PPO | Admitting: Urology

## 2021-02-23 ENCOUNTER — Other Ambulatory Visit: Payer: Self-pay

## 2021-02-23 ENCOUNTER — Encounter: Payer: Self-pay | Admitting: Urology

## 2021-02-23 VITALS — BP 159/86 | HR 83 | Wt 265.0 lb

## 2021-02-23 DIAGNOSIS — Z87440 Personal history of urinary (tract) infections: Secondary | ICD-10-CM

## 2021-02-23 DIAGNOSIS — R310 Gross hematuria: Secondary | ICD-10-CM

## 2021-02-23 LAB — URINALYSIS, ROUTINE W REFLEX MICROSCOPIC
Bilirubin, UA: NEGATIVE
Glucose, UA: NEGATIVE
Ketones, UA: NEGATIVE
Leukocytes,UA: NEGATIVE
Nitrite, UA: NEGATIVE
Protein,UA: NEGATIVE
Specific Gravity, UA: 1.01 (ref 1.005–1.030)
Urobilinogen, Ur: 0.2 mg/dL (ref 0.2–1.0)
pH, UA: 6.5 (ref 5.0–7.5)

## 2021-02-23 LAB — MICROSCOPIC EXAMINATION: Renal Epithel, UA: NONE SEEN /hpf

## 2021-02-23 MED ORDER — NITROFURANTOIN MONOHYD MACRO 100 MG PO CAPS: 100.0000 mg | ORAL_CAPSULE | Freq: Every day | ORAL | 2 refills | Status: DC

## 2021-02-23 NOTE — Progress Notes (Signed)
Urological Symptom Review  Patient is experiencing the following symptoms: Get up at night to urinate Leakage of urine Urinary tract infection   Review of Systems  Gastrointestinal (upper)  : Negative for upper GI symptoms  Gastrointestinal (lower) : Negative for lower GI symptoms  Constitutional : Negative for symptoms  Skin: Negative for skin symptoms  Eyes: Negative for eye symptoms  Ear/Nose/Throat : Negative for Ear/Nose/Throat symptoms  Hematologic/Lymphatic: Negative for Hematologic/Lymphatic symptoms  Cardiovascular : Negative for cardiovascular symptoms  Respiratory : Negative for respiratory symptoms  Endocrine: Negative for endocrine symptoms  Musculoskeletal: Negative for musculoskeletal symptoms  Neurological: Negative for neurological symptoms  Psychologic: Negative for psychiatric symptoms

## 2021-02-23 NOTE — Addendum Note (Signed)
Addended by: Tyrone Apple on: 02/23/2021 03:36 PM   Modules accepted: Orders

## 2021-02-23 NOTE — Addendum Note (Signed)
Addended byIris Pert on: 02/23/2021 04:53 PM   Modules accepted: Orders
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
## 2021-02-24 LAB — CYTOLOGY - NON PAP

## 2021-03-03 ENCOUNTER — Other Ambulatory Visit: Payer: BC Managed Care – PPO | Admitting: Urology

## 2021-03-14 ENCOUNTER — Ambulatory Visit (HOSPITAL_COMMUNITY): Payer: BC Managed Care – PPO

## 2021-03-15 ENCOUNTER — Other Ambulatory Visit: Payer: BC Managed Care – PPO | Admitting: Urology

## 2021-04-06 ENCOUNTER — Ambulatory Visit: Payer: BC Managed Care – PPO | Admitting: Urology
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
## 2021-04-14 ENCOUNTER — Ambulatory Visit: Payer: BC Managed Care – PPO | Admitting: Urology
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
## 2021-04-25 ENCOUNTER — Other Ambulatory Visit: Payer: Self-pay | Admitting: "Endocrinology

## 2021-04-25 DIAGNOSIS — D3502 Benign neoplasm of left adrenal gland: Secondary | ICD-10-CM

## 2021-04-26 LAB — COMPREHENSIVE METABOLIC PANEL
ALT: 16 IU/L (ref 0–32)
AST: 16 IU/L (ref 0–40)
Albumin/Globulin Ratio: 2 (ref 1.2–2.2)
Albumin: 4.4 g/dL (ref 3.8–4.9)
Alkaline Phosphatase: 84 IU/L (ref 44–121)
BUN/Creatinine Ratio: 15 (ref 9–23)
BUN: 13 mg/dL (ref 6–24)
Bilirubin Total: 0.2 mg/dL (ref 0.0–1.2)
CO2: 26 mmol/L (ref 20–29)
Calcium: 9.4 mg/dL (ref 8.7–10.2)
Chloride: 102 mmol/L (ref 96–106)
Creatinine, Ser: 0.87 mg/dL (ref 0.57–1.00)
Globulin, Total: 2.2 g/dL (ref 1.5–4.5)
Glucose: 104 mg/dL — ABNORMAL HIGH (ref 70–99)
Potassium: 4.4 mmol/L (ref 3.5–5.2)
Sodium: 143 mmol/L (ref 134–144)
Total Protein: 6.6 g/dL (ref 6.0–8.5)
eGFR: 81 mL/min/{1.73_m2} (ref >59)

## 2021-04-26 LAB — CORTISOL-AM, BLOOD: Cortisol - AM: 10.5 ug/dL (ref 6.2–19.4)

## 2021-04-29 ENCOUNTER — Ambulatory Visit: Payer: BC Managed Care – PPO | Admitting: "Endocrinology

## 2021-05-04 ENCOUNTER — Ambulatory Visit: Payer: Medicaid Other | Admitting: "Endocrinology

## 2021-05-10 ENCOUNTER — Encounter: Payer: Self-pay | Admitting: "Endocrinology

## 2021-05-10 ENCOUNTER — Ambulatory Visit: Payer: BC Managed Care – PPO | Admitting: "Endocrinology

## 2021-05-10 VITALS — BP 130/80 | HR 64 | Ht 62.0 in | Wt 265.2 lb

## 2021-05-10 DIAGNOSIS — R7303 Prediabetes: Secondary | ICD-10-CM | POA: Diagnosis not present

## 2021-05-10 DIAGNOSIS — D3502 Benign neoplasm of left adrenal gland: Secondary | ICD-10-CM | POA: Diagnosis not present

## 2021-05-10 NOTE — Patient Instructions (Signed)
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
## 2021-05-26 ENCOUNTER — Telehealth: Payer: Self-pay | Admitting: Internal Medicine

## 2021-05-26 NOTE — Telephone Encounter (Signed)
Patient states that she has been having this feeling x 4-5 months.  States she can feel pounding in both her ears.  Does not notice any increase in palpitations or racing of her heart.  Still taking the Lopressor '25mg'$  twice a day with an additional tab as needed for extra palpitations.  States she has to do this occasionally, but nothing bothersome.   ? ?She did also see her pcp - had fluid in both ears with right enlarged - was put on antibiotics.  She called her pcp back because she didn't think the ABO had helped at all.  So she was then referred to ENT - appointment is not until June.   ? ?No other c/o chest pain or SOB.  BP running 130/80 & HR 80's up to 100 if she is up moving around.   ? ?Stated that she did not do the stress test previously because she had taken her Lopressor that morning.   ?

## 2021-05-26 NOTE — Telephone Encounter (Signed)
Patient states she is having a pulsing in her ears and was told by her PCP to contact her cardiologist.  ?

## 2021-05-27 ENCOUNTER — Encounter: Payer: Self-pay | Admitting: *Deleted

## 2021-05-27 NOTE — Telephone Encounter (Signed)
Notified via mychart.

## 2021-05-27 NOTE — Telephone Encounter (Signed)
Pulsating in ears would not be a cardiac issue, needs to f/u with pcp and reconsider alternative specialist to see. If scheduled with ENT would look to hear what they say ? ?Zandra Abts MD ?

## 2021-06-23 DIAGNOSIS — I499 Cardiac arrhythmia, unspecified: Secondary | ICD-10-CM | POA: Insufficient documentation

## 2021-06-29 ENCOUNTER — Other Ambulatory Visit: Payer: Self-pay | Admitting: Urology

## 2021-06-29 DIAGNOSIS — Z87440 Personal history of urinary (tract) infections: Secondary | ICD-10-CM

## 2021-07-20 ENCOUNTER — Ambulatory Visit (INDEPENDENT_AMBULATORY_CARE_PROVIDER_SITE_OTHER): Payer: BC Managed Care – PPO | Admitting: Internal Medicine

## 2021-07-20 ENCOUNTER — Encounter: Payer: Self-pay | Admitting: Internal Medicine

## 2021-07-20 ENCOUNTER — Telehealth: Payer: Self-pay | Admitting: Internal Medicine

## 2021-07-20 VITALS — BP 110/70 | HR 67 | Ht 62.0 in | Wt 264.8 lb

## 2021-07-20 DIAGNOSIS — I10 Essential (primary) hypertension: Secondary | ICD-10-CM

## 2021-07-20 DIAGNOSIS — R002 Palpitations: Secondary | ICD-10-CM | POA: Diagnosis not present

## 2021-07-20 DIAGNOSIS — R011 Cardiac murmur, unspecified: Secondary | ICD-10-CM | POA: Diagnosis not present

## 2021-07-20 NOTE — Addendum Note (Signed)
Addended by: Christella Scheuermann C on: 07/20/2021 10:10 AM   Modules accepted: Orders

## 2021-07-20 NOTE — Patient Instructions (Signed)
Medication Instructions:  Your physician recommends that you continue on your current medications as directed. Please refer to the Current Medication list given to you today.   Labwork: None  Testing/Procedures: Your physician has requested that you have an echocardiogram. Echocardiography is a painless test that uses sound waves to create images of your heart. It provides your doctor with information about the size and shape of your heart and how well your heart's chambers and valves are working. This procedure takes approximately one hour. There are no restrictions for this procedure.   Follow-Up: Follow up in 3-6 months with Dr. Harl Bowie  Any Other Special Instructions Will Be Listed Below (If Applicable).     If you need a refill on your cardiac medications before your next appointment, please call your pharmacy.

## 2021-07-20 NOTE — Telephone Encounter (Signed)
Patient patient during appointment this morning with Dr. Debara Pickett an ultrasound was discussed and she mentioned recently having an ultrasound with an office in [place], [place]. She states she was able to contact that office and they faxed a copy of the ultrasound to the office. She would like to confirm it has been received. Please advise.

## 2021-07-20 NOTE — Telephone Encounter (Signed)
Fax received regarding Carotid Duplex.
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
## 2021-07-22 ENCOUNTER — Ambulatory Visit: Payer: BC Managed Care – PPO | Admitting: Urology

## 2021-07-26 ENCOUNTER — Ambulatory Visit: Payer: BC Managed Care – PPO | Admitting: Urology

## 2021-08-01 ENCOUNTER — Ambulatory Visit (INDEPENDENT_AMBULATORY_CARE_PROVIDER_SITE_OTHER): Payer: BC Managed Care – PPO

## 2021-08-01 DIAGNOSIS — R011 Cardiac murmur, unspecified: Secondary | ICD-10-CM | POA: Diagnosis not present

## 2021-08-01 LAB — ECHOCARDIOGRAM COMPLETE
Area-P 1/2: 3.54 cm2
Calc EF: 62.7 %
MV M vel: 4.33 m/s
MV Peak grad: 75 mmHg
S' Lateral: 3.1 cm
Single Plane A2C EF: 62.5 %
Single Plane A4C EF: 61.2 %

## 2021-08-23 ENCOUNTER — Other Ambulatory Visit: Payer: Self-pay | Admitting: Urology

## 2021-08-23 DIAGNOSIS — Z87440 Personal history of urinary (tract) infections: Secondary | ICD-10-CM

## 2021-08-24 DIAGNOSIS — M5459 Other low back pain: Secondary | ICD-10-CM | POA: Insufficient documentation

## 2021-08-24 DIAGNOSIS — M47816 Spondylosis without myelopathy or radiculopathy, lumbar region: Secondary | ICD-10-CM | POA: Insufficient documentation

## 2021-09-14 ENCOUNTER — Telehealth: Payer: Self-pay

## 2021-09-14 MED ORDER — ATORVASTATIN CALCIUM 20 MG PO TABS: 20.0000 mg | ORAL_TABLET | Freq: Every day | ORAL | 0 refills | Status: DC

## 2021-09-14 NOTE — Telephone Encounter (Signed)
Received notification from [place] requesting refills for atorvastatin 20 mg once a day. This medication was on her reconciliation list and was prescribed by a [facility] provider.

## 2021-09-14 NOTE — Addendum Note (Signed)
Addended by: Sung Amabile on: 09/14/2021 04:53 PM   Modules accepted: Orders

## 2021-09-30 ENCOUNTER — Other Ambulatory Visit: Payer: Self-pay | Admitting: Urology

## 2021-09-30 DIAGNOSIS — Z87440 Personal history of urinary (tract) infections: Secondary | ICD-10-CM

## 2021-10-14 ENCOUNTER — Other Ambulatory Visit: Payer: Self-pay | Admitting: *Deleted

## 2021-10-14 MED ORDER — METOPROLOL TARTRATE 25 MG PO TABS: ORAL_TABLET | ORAL | 3 refills | Status: DC

## 2021-11-03 ENCOUNTER — Other Ambulatory Visit: Payer: Self-pay | Admitting: Urology

## 2021-11-03 DIAGNOSIS — Z87440 Personal history of urinary (tract) infections: Secondary | ICD-10-CM

## 2021-11-04 ENCOUNTER — Telehealth: Payer: Self-pay

## 2021-11-04 NOTE — Telephone Encounter (Signed)
Patient called and appointment scheduled.

## 2021-11-04 NOTE — Telephone Encounter (Signed)
-----   Message from Primus Bravo, MD sent at 11/03/2021  9:23 AM EDT ----- Please schedule f/u appt for this patient.  Not seen since January 2023.

## 2021-11-09 ENCOUNTER — Ambulatory Visit: Payer: BC Managed Care – PPO | Admitting: "Endocrinology

## 2021-11-10 ENCOUNTER — Ambulatory Visit (INDEPENDENT_AMBULATORY_CARE_PROVIDER_SITE_OTHER): Payer: BC Managed Care – PPO | Admitting: Urology

## 2021-11-10 ENCOUNTER — Encounter: Payer: Self-pay | Admitting: Urology

## 2021-11-10 VITALS — BP 131/83 | HR 69 | Ht 62.0 in | Wt 260.0 lb

## 2021-11-10 DIAGNOSIS — Z87440 Personal history of urinary (tract) infections: Secondary | ICD-10-CM

## 2021-11-10 DIAGNOSIS — R310 Gross hematuria: Secondary | ICD-10-CM

## 2021-11-10 LAB — URINALYSIS, ROUTINE W REFLEX MICROSCOPIC
Bilirubin, UA: NEGATIVE
Glucose, UA: NEGATIVE
Ketones, UA: NEGATIVE
Nitrite, UA: NEGATIVE
Protein,UA: NEGATIVE
Specific Gravity, UA: 1.015 (ref 1.005–1.030)
Urobilinogen, Ur: 0.2 mg/dL (ref 0.2–1.0)
pH, UA: 7.5 (ref 5.0–7.5)

## 2021-11-10 LAB — MICROSCOPIC EXAMINATION: RBC, Urine: NONE SEEN /hpf (ref 0–2)
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
## 2021-11-15 ENCOUNTER — Encounter: Payer: Self-pay | Admitting: Urology

## 2021-11-15 NOTE — Telephone Encounter (Signed)
Would you like to see her again?

## 2021-12-06 ENCOUNTER — Other Ambulatory Visit: Payer: Self-pay | Admitting: Cardiology

## 2021-12-06 ENCOUNTER — Ambulatory Visit: Payer: BC Managed Care – PPO | Admitting: "Endocrinology

## 2021-12-07 ENCOUNTER — Encounter: Payer: Self-pay | Admitting: Cardiology

## 2021-12-07 ENCOUNTER — Encounter: Payer: Self-pay | Admitting: *Deleted

## 2021-12-07 ENCOUNTER — Ambulatory Visit: Payer: BC Managed Care – PPO | Attending: Cardiology | Admitting: Cardiology

## 2021-12-07 VITALS — BP 102/78 | HR 75 | Ht 62.0 in | Wt 264.8 lb

## 2021-12-07 DIAGNOSIS — I10 Essential (primary) hypertension: Secondary | ICD-10-CM

## 2021-12-07 DIAGNOSIS — R002 Palpitations: Secondary | ICD-10-CM

## 2021-12-07 DIAGNOSIS — E782 Mixed hyperlipidemia: Secondary | ICD-10-CM | POA: Diagnosis not present

## 2021-12-07 MED ORDER — METOPROLOL TARTRATE 25 MG PO TABS: ORAL_TABLET | ORAL | 3 refills | Status: DC

## 2021-12-07 MED ORDER — ATORVASTATIN CALCIUM 20 MG PO TABS: 20.0000 mg | ORAL_TABLET | Freq: Every day | ORAL | 3 refills | Status: DC

## 2021-12-07 NOTE — Patient Instructions (Signed)
Medication Instructions:  Continue all current medications.   Labwork: none  Testing/Procedures: none  Follow-Up: 6 months   Any Other Special Instructions Will Be Listed Below (If Applicable).   If you need a refill on your cardiac medications before your next appointment, please call your pharmacy.  
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
## 2021-12-26 ENCOUNTER — Ambulatory Visit: Payer: BC Managed Care – PPO | Admitting: Urology
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
## 2022-04-27 ENCOUNTER — Encounter: Payer: BC Managed Care – PPO | Admitting: Gastroenterology
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
## 2022-05-04 ENCOUNTER — Ambulatory Visit: Payer: BC Managed Care – PPO | Admitting: Gastroenterology

## 2022-06-20 ENCOUNTER — Ambulatory Visit: Payer: BC Managed Care – PPO | Admitting: Cardiology
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
## 2022-07-11 ENCOUNTER — Ambulatory Visit: Payer: BC Managed Care – PPO | Admitting: Adult Health

## 2022-07-21 ENCOUNTER — Ambulatory Visit: Payer: BC Managed Care – PPO | Attending: Cardiology | Admitting: Nurse Practitioner

## 2022-07-21 ENCOUNTER — Encounter: Payer: Self-pay | Admitting: Nurse Practitioner

## 2022-07-21 VITALS — BP 138/80 | HR 70 | Ht 62.0 in | Wt 279.8 lb

## 2022-07-21 DIAGNOSIS — Z79899 Other long term (current) drug therapy: Secondary | ICD-10-CM | POA: Diagnosis not present

## 2022-07-21 DIAGNOSIS — E782 Mixed hyperlipidemia: Secondary | ICD-10-CM | POA: Diagnosis not present

## 2022-07-21 DIAGNOSIS — G4733 Obstructive sleep apnea (adult) (pediatric): Secondary | ICD-10-CM

## 2022-07-21 DIAGNOSIS — Z87898 Personal history of other specified conditions: Secondary | ICD-10-CM | POA: Diagnosis not present

## 2022-07-21 DIAGNOSIS — H93A9 Pulsatile tinnitus, unspecified ear: Secondary | ICD-10-CM

## 2022-07-21 MED ORDER — HYDROCHLOROTHIAZIDE 12.5 MG PO TABS: 12.5000 mg | ORAL_TABLET | Freq: Every day | ORAL | 1 refills | Status: DC

## 2022-07-21 MED ORDER — METOPROLOL TARTRATE 25 MG PO TABS: ORAL_TABLET | ORAL | 1 refills | Status: DC
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
## 2022-07-21 NOTE — Patient Instructions (Addendum)
Medication Instructions:   Metoprolol & Hydrochlorothiazide refilled today  Continue all other medications.     Labwork:  FLP, LFT - orders given  Reminder:  Nothing to eat or drink after 12 midnight prior to labs. Please do in 2 weeks  Office will contact with results via phone, letter or mychart.     Testing/Procedures:  none  Follow-Up:  6 months   Any Other Special Instructions Will Be Listed Below (If Applicable).   If you need a refill on your cardiac medications before your next appointment, please call your pharmacy.

## 2022-08-30 ENCOUNTER — Encounter (INDEPENDENT_AMBULATORY_CARE_PROVIDER_SITE_OTHER): Payer: BC Managed Care – PPO | Admitting: Family Medicine

## 2022-08-31 ENCOUNTER — Encounter (INDEPENDENT_AMBULATORY_CARE_PROVIDER_SITE_OTHER): Payer: BC Managed Care – PPO | Admitting: Family Medicine

## 2022-09-12 ENCOUNTER — Encounter: Payer: Self-pay | Admitting: Pulmonary Disease

## 2022-09-12 ENCOUNTER — Ambulatory Visit: Payer: BC Managed Care – PPO | Admitting: Pulmonary Disease

## 2022-10-16 ENCOUNTER — Telehealth: Payer: Self-pay | Admitting: Pulmonary Disease

## 2022-10-16 DIAGNOSIS — G4733 Obstructive sleep apnea (adult) (pediatric): Secondary | ICD-10-CM

## 2022-10-16 NOTE — Telephone Encounter (Signed)
Patient's CPAP machine currently says "moder life exceeded contact care provided". She was wondering if that meant she needed as new machine.

## 2022-10-20 NOTE — Telephone Encounter (Signed)
CPAP order placed

## 2022-10-20 NOTE — Telephone Encounter (Signed)
Please send an order to get her set up with a new Resmed auto CPAP 5 to 12 cm H2O with heated humidity.  She needs an ROV in 4 months.

## 2022-10-20 NOTE — Telephone Encounter (Signed)
Dr. Craige Cotta, please advise on this for pt.

## 2022-10-31 ENCOUNTER — Ambulatory Visit: Payer: BC Managed Care – PPO | Admitting: Adult Health
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
## 2022-11-02 ENCOUNTER — Ambulatory Visit: Payer: BC Managed Care – PPO | Admitting: Urology

## 2022-11-02 DIAGNOSIS — R300 Dysuria: Secondary | ICD-10-CM

## 2022-11-02 DIAGNOSIS — N390 Urinary tract infection, site not specified: Secondary | ICD-10-CM

## 2022-11-14 ENCOUNTER — Ambulatory Visit: Payer: BC Managed Care – PPO | Admitting: Urology

## 2022-11-28 ENCOUNTER — Ambulatory Visit: Payer: BC Managed Care – PPO | Admitting: Adult Health

## 2022-12-27 ENCOUNTER — Ambulatory Visit: Payer: BC Managed Care – PPO | Admitting: Adult Health

## 2022-12-27 ENCOUNTER — Encounter: Payer: Self-pay | Admitting: Adult Health

## 2023-01-18 ENCOUNTER — Ambulatory Visit: Payer: BC Managed Care – PPO | Admitting: Nurse Practitioner

## 2023-03-05 ENCOUNTER — Ambulatory Visit: Payer: BC Managed Care – PPO | Admitting: Nurse Practitioner
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
## 2023-04-10 ENCOUNTER — Encounter: Payer: Self-pay | Admitting: Nurse Practitioner

## 2023-04-10 ENCOUNTER — Ambulatory Visit: Payer: BC Managed Care – PPO | Attending: Nurse Practitioner | Admitting: Nurse Practitioner

## 2023-04-10 VITALS — BP 116/68 | HR 63 | Ht 62.0 in | Wt 292.3 lb

## 2023-04-10 DIAGNOSIS — R002 Palpitations: Secondary | ICD-10-CM

## 2023-04-10 DIAGNOSIS — Z952 Presence of prosthetic heart valve: Secondary | ICD-10-CM

## 2023-04-10 DIAGNOSIS — G4733 Obstructive sleep apnea (adult) (pediatric): Secondary | ICD-10-CM | POA: Diagnosis not present

## 2023-04-10 DIAGNOSIS — E785 Hyperlipidemia, unspecified: Secondary | ICD-10-CM

## 2023-04-10 DIAGNOSIS — I272 Pulmonary hypertension, unspecified: Secondary | ICD-10-CM | POA: Diagnosis not present

## 2023-04-10 DIAGNOSIS — I38 Endocarditis, valve unspecified: Secondary | ICD-10-CM

## 2023-04-10 DIAGNOSIS — H93A9 Pulsatile tinnitus, unspecified ear: Secondary | ICD-10-CM

## 2023-04-10 MED ORDER — METOPROLOL TARTRATE 25 MG PO TABS: 12.5000 mg | ORAL_TABLET | Freq: Four times a day (QID) | ORAL | 1 refills | Status: DC
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
## 2023-04-10 NOTE — Patient Instructions (Addendum)
 Medication Instructions:  Your physician has recommended you make the following change in your medication:  Please Increase Metoprolol to Take 0.5 tablets (12.5 mg total) by mouth in the morning, at noon, in the evening, and at bedtime. Take an extra 12.5 Mg tablet nightly   Labwork: None   Testing/Procedures: Your physician has requested that you have an echocardiogram. Echocardiography is a painless test that uses sound waves to create images of your heart. It provides your doctor with information about the size and shape of your heart and how well your heart's chambers and valves are working. This procedure takes approximately one hour. There are no restrictions for this procedure. Please do NOT wear cologne, perfume, aftershave, or lotions (deodorant is allowed). Please arrive 15 minutes prior to your appointment time.  Please note: We ask at that you not bring children with you during ultrasound (echo/ vascular) testing. Due to room size and safety concerns, children are not allowed in the ultrasound rooms during exams. Our front office staff cannot provide observation of children in our lobby area while testing is being conducted. An adult accompanying a patient to their appointment will only be allowed in the ultrasound room at the discretion of the ultrasound technician under special circumstances. We apologize for any inconvenience.  Follow-Up: Your physician recommends that you schedule a follow-up appointment in: 3 months   Any Other Special Instructions Will Be Listed Below (If Applicable).  If you need a refill on your cardiac medications before your next appointment, please call your pharmacy.

## 2023-04-19 NOTE — Progress Notes (Signed)
 Our office has been faxed a copy of her most recent lab work from her primary care provider's office, Dr. Sherryll Burger. Labs were obtained 10/2022.   Lab work reveals normal CBC, normal TSH.  Lipid panel reveals total cholesterol 191, triglycerides 222, HDL 61, and LDL of 93. CMP was unremarkable.   Will continue lifestyle modifications as previously recommended. Continue to follow with PCP.  Sharlene Dory, NP

## 2023-05-02 ENCOUNTER — Ambulatory Visit: Attending: Nurse Practitioner

## 2023-05-02 DIAGNOSIS — I38 Endocarditis, valve unspecified: Secondary | ICD-10-CM | POA: Diagnosis not present

## 2023-05-02 DIAGNOSIS — I083 Combined rheumatic disorders of mitral, aortic and tricuspid valves: Secondary | ICD-10-CM

## 2023-05-02 LAB — ECHOCARDIOGRAM COMPLETE
AR max vel: 1.97 cm2
AV Area VTI: 1.88 cm2
AV Area mean vel: 2.08 cm2
AV Mean grad: 6 mmHg
AV Peak grad: 12.8 mmHg
Ao pk vel: 1.79 m/s
Area-P 1/2: 3.43 cm2
Calc EF: 70.5 %
MV VTI: 1.93 cm2
S' Lateral: 2.9 cm
Single Plane A2C EF: 75.4 %
Single Plane A4C EF: 64 %

## 2023-06-07 ENCOUNTER — Telehealth: Payer: Self-pay | Admitting: "Endocrinology

## 2023-06-07 ENCOUNTER — Other Ambulatory Visit: Payer: Self-pay | Admitting: *Deleted

## 2023-06-07 DIAGNOSIS — I272 Pulmonary hypertension, unspecified: Secondary | ICD-10-CM

## 2023-06-07 DIAGNOSIS — R7303 Prediabetes: Secondary | ICD-10-CM

## 2023-06-07 DIAGNOSIS — G4733 Obstructive sleep apnea (adult) (pediatric): Secondary | ICD-10-CM

## 2023-06-07 DIAGNOSIS — R002 Palpitations: Secondary | ICD-10-CM

## 2023-06-07 DIAGNOSIS — E782 Mixed hyperlipidemia: Secondary | ICD-10-CM

## 2023-06-07 DIAGNOSIS — E559 Vitamin D deficiency, unspecified: Secondary | ICD-10-CM

## 2023-06-07 DIAGNOSIS — R09A2 Foreign body sensation, throat: Secondary | ICD-10-CM

## 2023-06-07 NOTE — Telephone Encounter (Signed)
 Patient is asking for new labs to be put in. She is going to the lab tomorrow for a f/u with Nida on next friday

## 2023-06-07 NOTE — Telephone Encounter (Signed)
Orders have been reentered.  

## 2023-06-13 ENCOUNTER — Ambulatory Visit: Payer: Self-pay | Admitting: Nurse Practitioner

## 2023-06-13 NOTE — Progress Notes (Signed)
 FYI.  I guess my name was attached as ordering provider for her lab tests-perhaps while you were gone.

## 2023-06-15 ENCOUNTER — Ambulatory Visit: Admitting: "Endocrinology

## 2023-06-15 ENCOUNTER — Encounter: Payer: Self-pay | Admitting: "Endocrinology

## 2023-06-15 VITALS — BP 136/82 | HR 80 | Ht 59.5 in | Wt 291.6 lb

## 2023-06-15 DIAGNOSIS — E782 Mixed hyperlipidemia: Secondary | ICD-10-CM

## 2023-06-15 DIAGNOSIS — R7303 Prediabetes: Secondary | ICD-10-CM | POA: Diagnosis not present

## 2023-06-15 DIAGNOSIS — E236 Other disorders of pituitary gland: Secondary | ICD-10-CM | POA: Insufficient documentation

## 2023-06-15 DIAGNOSIS — D3502 Benign neoplasm of left adrenal gland: Secondary | ICD-10-CM

## 2023-06-15 DIAGNOSIS — D3501 Benign neoplasm of right adrenal gland: Secondary | ICD-10-CM | POA: Diagnosis not present

## 2023-06-15 LAB — POCT GLYCOSYLATED HEMOGLOBIN (HGB A1C): HbA1c, POC (prediabetic range): 5.8 % (ref 5.7–6.4)
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
## 2023-06-15 NOTE — Patient Instructions (Signed)

## 2023-06-17 LAB — METANEPHRINES, PLASMA
Metanephrine, Free: <25 pg/mL (ref 0.0–88.0)
Normetanephrine, Free: 31.7 pg/mL (ref 0.0–244.0)

## 2023-06-17 LAB — COMPREHENSIVE METABOLIC PANEL WITH GFR
ALT: 19 IU/L (ref 0–32)
AST: 18 IU/L (ref 0–40)
Albumin: 4.3 g/dL (ref 3.8–4.9)
Alkaline Phosphatase: 84 IU/L (ref 44–121)
BUN/Creatinine Ratio: 19 (ref 9–23)
BUN: 17 mg/dL (ref 6–24)
Bilirubin Total: 0.2 mg/dL (ref 0.0–1.2)
CO2: 25 mmol/L (ref 20–29)
Calcium: 9.4 mg/dL (ref 8.7–10.2)
Chloride: 101 mmol/L (ref 96–106)
Creatinine, Ser: 0.88 mg/dL (ref 0.57–1.00)
Globulin, Total: 2.4 g/dL (ref 1.5–4.5)
Glucose: 98 mg/dL (ref 70–99)
Potassium: 4.5 mmol/L (ref 3.5–5.2)
Sodium: 142 mmol/L (ref 134–144)
Total Protein: 6.7 g/dL (ref 6.0–8.5)
eGFR: 79 mL/min/{1.73_m2} (ref >59)

## 2023-06-17 LAB — TSH: TSH: 1.05 u[IU]/mL (ref 0.450–4.500)

## 2023-06-17 LAB — CORTISOL-AM, BLOOD: Cortisol - AM: 8.2 ug/dL (ref 6.2–19.4)

## 2023-06-17 LAB — T4, FREE: Free T4: 1.16 ng/dL (ref 0.82–1.77)

## 2023-07-04 NOTE — ED Provider Notes (Signed)
 Emergency Department Provider Note    ED Clinical Impression   Final diagnoses:  Other migraine without status migrainosus, not intractable (Primary)  Paresthesia    ED Assessment/Plan    Condition: Stable Disposition: Discharge  This chart has been completed using Dragon Medical Dictation software, and while attempts have been made to ensure accuracy, certain words and phrases may not be transcribed as intended.   History   Chief Complaint  Patient presents with  . Facial Numbness    Left   HPI  Rachel Holland is a 53 y.o. female  who presents today to the  emergency department complaining of gradual onset headache for the apst 4 days associated with some left facial numbness. Headache is described as mild and similar to previous headaches. Pt has upcoming appointment with headache specialist. She states he was worried about the numbness of the face. No visual or speech changes. No fever or chills. She describes her symptoms as moderate.     Allergies: is allergic to sulfa (sulfonamide antibiotics) and sulfur. Medications: has a current medication list which includes the following long-term medication(s): atorvastatin , clonazepam, hydrochlorothiazide , and metoprolol  succinate. PMHx:  has a past medical history of Degenerative disc disease, cervical, Depression, Elevated cholesterol, Facet arthritis, degenerative, L5-S1 level, lumbosacral spine, Migraines, Pulmonary hypertension, and Pulmonary hypertension. PSHx:  has no past surgical history on file. SocHx:  reports that she quit smoking about 15 years ago. Her smoking use included cigarettes. She started smoking about 35 years ago. She has a 20 pack-year smoking history. She has never used smokeless tobacco. She reports that she does not drink alcohol and does not use drugs. Allergies, Medications, Medical, Surgical, and Social History  were reviewed as documented above.   Social Drivers of Health with Concerns   Food Insecurity: Not on file  Tobacco Use: Medium Risk (06/15/2023)   Received from [facility]   Patient History   . Smoking Tobacco Use: Former   . Smokeless Tobacco Use: Never   . Passive Exposure: Never  Transportation Needs: Not on file  Housing: Not on file  Physical Activity: Not on file  Utilities: Not on file  Stress: Not on file  Interpersonal Safety: Not on file  Substance Use: Not on file (12/05/2022)  Social Connections: Unknown (06/12/2021)   Received from [facility]   Social Network   . Social Network: Not on Actuary Strain: Not on file  Internet Connectivity: Not on file     Review Of Systems  Review of Systems  Constitutional:  Negative for fever.  HENT:  Negative for congestion.   Respiratory:  Negative for chest tightness and shortness of breath.   Cardiovascular:  Negative for chest pain.  Gastrointestinal:  Negative for abdominal pain.  Skin:  Negative for color change.  Neurological:  Positive for dizziness and headaches. Negative for facial asymmetry, speech difficulty and weakness.  Psychiatric/Behavioral:  Negative for behavioral problems.   All other systems reviewed and are negative.   Physical Exam   BP 147/75   Pulse 74   Temp 36.7 C (98 F) (Oral)   Resp 16   Ht 157.5 cm (5' 2)   Wt (!) 130.5 kg (287 lb 9.6 oz)   LMP 06/13/2020   SpO2 99%   BMI 52.60  kg/m   Physical Exam Vitals and nursing note reviewed.  Constitutional:      General: She is not in acute distress. HENT:     Head: Normocephalic.  Eyes:     Conjunctiva/sclera: Conjunctivae normal.  Cardiovascular:     Rate and Rhythm: Regular rhythm.     Pulses: Normal pulses.     Heart sounds: Normal heart sounds.  Pulmonary:     Effort: No respiratory distress.     Breath sounds: Normal breath sounds.  Abdominal:     General: There is no distension.   Musculoskeletal:        General: No deformity.  Skin:    General: Skin is warm.     Capillary Refill: Capillary refill takes 2 to 3 seconds.     Comments: Normal cap refill.  Neurological:     Mental Status: She is oriented to person, place, and time.     Cranial Nerves: No cranial nerve deficit.     Motor: No weakness.     Comments: There is slight decreased sensation of the left face. Otherwise no other sensory deficits. No motor or cerebellar deficits. GCS is 15. NIHSS is 1 ( given slight facial paresthesia).   Psychiatric:        Mood and Affect: Mood normal.     ED Course  Medical Decision Making Clinical picture suggests complex migraine. However, given paresthesia. We will get CTA to r/o CVA which is unlikely.  9:20 PM Patient is doing well.  Feels better.  Patient stable for discharge.  I have reviewed my clinical findings and studies and my clinical impression with the patient. The patient has expressed understanding that at this time there is no evidence for a more malignant underlying process, but the patient also understands that early in the process of a condition such as this, an initial workup can be falsely reassuring. I have counseled the patient and discussed follow-up with the patient, stressing the importance of appropriate follow-up. I have also counseled the patient to return if worse or any concerns. Routine discharge counseling was given to the patient and the patient understands that worsening, changing or persistent symptoms should prompt an immediate call or follow up with their primary physician or return to the emergency department for reevaluation. Patient has expressed understanding.     Problems Addressed: Other migraine without status migrainosus, not intractable: acute illness or injury that poses a threat to life or bodily functions Paresthesia: acute illness or injury that poses a threat to life or bodily functions  Amount and/or Complexity of  Data Reviewed Labs: ordered. Decision-making details documented in ED Course. Radiology: ordered. Decision-making details documented in ED Course.  Risk Prescription drug management. Decision regarding hospitalization.     Procedures   No results found for this visit on 07/04/23 (from the past 4464 hours).   ED Results Results for orders placed or performed during the hospital encounter of 07/04/23  Magnesium  Result Value Ref Range   Magnesium 1.8 1.6 - 2.6 mg/dL  Basic Metabolic Panel  Result Value Ref Range   Sodium 142 135 - 145 mmol/L   Potassium 3.6 3.5 - 5.0 mmol/L   Chloride 104 98 - 107 mmol/L   CO2 30.6 21.0 - 32.0 mmol/L   Anion Gap 7 3 - 11 mmol/L   BUN 18 8 - 20 mg/dL   Creatinine 8.87 (H) 9.39 - 1.10 mg/dL   BUN/Creatinine Ratio 16    eGFR CKD-EPI (2021) Female 59 (L) >=  60 mL/min/1.2m2   Glucose 104 70 - 179 mg/dL   Calcium  8.9 8.5 - 10.1 mg/dL  CBC w/ Differential  Result Value Ref Range   WBC 10.5 4.0 - 10.5 10*9/L   RBC 4.36 3.80 - 5.10 10*12/L   HGB 13.1 11.5 - 15.0 g/dL   HCT 61.3 65.9 - 55.9 %   MCV 88.5 80.0 - 98.0 fL   MCH 30.0 27.0 - 34.0 pg   MCHC 33.9 32.0 - 36.0 g/dL   RDW 86.1 88.4 - 85.4 %   MPV 9.9 7.4 - 10.4 fL   Platelet 286 140 - 415 10*9/L   Neutrophils % 62.3 %   Lymphocytes % 27.4 %   Monocytes % 6.5 %   Eosinophils % 2.4 %   Basophils % 0.7 %   Absolute Neutrophils 6.5 1.8 - 7.8 10*9/L   Absolute Lymphocytes 2.9 0.7 - 4.5 10*9/L   Absolute Monocytes 0.7 0.1 - 1.0 10*9/L   Absolute Eosinophils 0.3 0.0 - 0.4 10*9/L   Absolute Basophils 0.1 0.0 - 0.2 10*9/L   CTA Head And Neck W Contrast Result Date: 07/04/2023 Exam: CT of the Head without Contrast, CT Angiogram of the Head and Neck with Contrast  History: Headache, paresthesias, left-sided facial numbness for 3 days  Technique: Routine CT head without IV contrast followed by CT through the head and neck with IV contrast. Image postprocessing and 3-D reconstructions are  performed at an independent workstation and the rendered images are sent to the patient's image file in PACS. AEC (automated exposure control) and/or manual techniques such as size-specific kV and mAs are employed where appropriate to reduce radiation exposure for all CT exams.  Comparison: None  Findings:  AORTIC ARCH: Normal configuration of the aortic arch. The proximal great vessels are unremarkable.  VERTEBRAL ARTERIES: Both vertebral arteries are patent.  RIGHT CAROTID SYSTEM: The common carotid artery is patent. The internal carotid artery is patent. The external carotid artery is patent.  LEFT CAROTID SYSTEM: The common carotid artery is patent. The internal carotid artery is patent. The external carotid artery is patent.  COMMENT:All measurements of stenosis are calculated using the NASCET method.  INTRACRANIAL: The distal internal carotid arteries are patent. The middle cerebral arteries are patent, with no high-grade stenosis or thrombosis. The anterior cerebral arteries and anterior communicating artery are patent.  Both vertebral arteries are patent. The vertebrobasilar system and its major branches are patent, including both posterior cerebral arteries. Fetal configuration of both PCAs; as a result, the basilar artery is relatively diminutive.  There is no cerebral aneurysm or evidence of vascular malformation.  BRAIN: No CT evidence of acute infarction, hemorrhage, edema, mass or mass effect. The ventricles are normal in size.  SOFT TISSUES: Negative. CALVARIUM: Negative. No fracture. SINUSES AND MASTOIDS: No significant mucosal thickening or fluid.    1.    No acute intracranial abnormality. 2.    Negative CT angiogram of the head. 3.    Negative CT angiogram of the neck.  Signed (Electronic Signature): 07/04/2023 8:47 PM Signed By: Neysa Argyle, MD  CT head without contrast Result Date: 07/04/2023 Exam: CT of the Head without Contrast, CT Angiogram of the Head and Neck with Contrast  History:  Headache, paresthesias, left-sided facial numbness for 3 days  Technique: Routine CT head without IV contrast followed by CT through the head and neck with IV contrast. Image postprocessing and 3-D reconstructions are performed at an independent workstation and the rendered images are sent to the  patient's image file in PACS. AEC (automated exposure control) and/or manual techniques such as size-specific kV and mAs are employed where appropriate to reduce radiation exposure for all CT exams.  Comparison: None  Findings:  AORTIC ARCH: Normal configuration of the aortic arch. The proximal great vessels are unremarkable.  VERTEBRAL ARTERIES: Both vertebral arteries are patent.  RIGHT CAROTID SYSTEM: The common carotid artery is patent. The internal carotid artery is patent. The external carotid artery is patent.  LEFT CAROTID SYSTEM: The common carotid artery is patent. The internal carotid artery is patent. The external carotid artery is patent.  COMMENT:All measurements of stenosis are calculated using the NASCET method.  INTRACRANIAL: The distal internal carotid arteries are patent. The middle cerebral arteries are patent, with no high-grade stenosis or thrombosis. The anterior cerebral arteries and anterior communicating artery are patent.  Both vertebral arteries are patent. The vertebrobasilar system and its major branches are patent, including both posterior cerebral arteries. Fetal configuration of both PCAs; as a result, the basilar artery is relatively diminutive.  There is no cerebral aneurysm or evidence of vascular malformation.  BRAIN: No CT evidence of acute infarction, hemorrhage, edema, mass or mass effect. The ventricles are normal in size.  SOFT TISSUES: Negative. CALVARIUM: Negative. No fracture. SINUSES AND MASTOIDS: No significant mucosal thickening or fluid.    1.    No acute intracranial abnormality. 2.    Negative CT angiogram of the head. 3.    Negative CT angiogram of the neck.  Signed  (Electronic Signature): 07/04/2023 8:47 PM Signed By: Neysa Argyle, MD   Medications Administered:  Medications  butalbital-acetaminophen -caffeine (ESGIC) per tablet 1 tablet (1 tablet Oral Refused 07/04/23 1927)  dexAMETHasone (DECADRON) injection 10 mg (10 mg Intravenous Refused 07/04/23 1928)  ondansetron  (ZOFRAN ) injection 4 mg (4 mg Intravenous Refused 07/04/23 1928)  sodium chloride 0.9% (NS) bolus 500 mL (500 mL Intravenous Refused 07/04/23 1928)  acetaminophen  (TYLENOL ) tablet 975 mg (975 mg Oral Given 07/04/23 1918)  iohexol  (OMNIPAQUE ) 350 mg iodine/mL solution 75 mL (75 mL Intravenous Given 07/04/23 1956)    Discharge Medications (Medications Prescribed during this  ED visit and Patient's Home Medications) :    Your Medication List     START taking these medications    butalbital-acetaminophen -caff 50-300-40 mg Cap Commonly known as: FIORICET Take 1 capsule by mouth every four (4) hours as needed for headache for up to 5 days.       ASK your doctor about these medications    atorvastatin  20 MG tablet Commonly known as: LIPITOR Take 1 tablet (20 mg total) by mouth daily.   clonazePAM 0.5 MG tablet Commonly known as: KlonoPIN Take 1 tablet (0.5 mg total) by mouth daily as needed.   ergocalciferol -1,250 mcg (50,000 unit) 1,250 mcg (50,000 unit) capsule Commonly known as: DRISDOL   estradiol 0.01 % (0.1 mg/gram) vaginal cream Commonly known as: ESTRACE Insert 1 g into the vagina Two (2) times a week.   hydroCHLOROthiazide  12.5 MG tablet Take 1 tablet (12.5 mg total) by mouth daily.   metoPROLOL  succinate 25 MG 24 hr tablet Commonly known as: Toprol -XL Take 1 tablet (25 mg total) by mouth daily.   ondansetron  4 MG disintegrating tablet Commonly known as: ZOFRAN -ODT Take 1-2 tablets (4-8 mg total) by mouth every eight (8) hours as needed for nausea for up to 16 doses.   pantoprazole  20 MG tablet Commonly known as: Protonix  Take 1 tablet (20 mg total) by mouth daily.  Cherie Ardeen Hanger, MD 07/04/23 2211

## 2023-07-05 ENCOUNTER — Other Ambulatory Visit: Payer: Self-pay | Admitting: Nurse Practitioner

## 2023-07-06 ENCOUNTER — Ambulatory Visit: Admitting: Urology

## 2023-07-06 ENCOUNTER — Encounter: Payer: Self-pay | Admitting: Urology

## 2023-07-06 VITALS — BP 140/82 | HR 92

## 2023-07-06 DIAGNOSIS — Z87440 Personal history of urinary (tract) infections: Secondary | ICD-10-CM

## 2023-07-06 DIAGNOSIS — R310 Gross hematuria: Secondary | ICD-10-CM | POA: Diagnosis not present

## 2023-07-06 LAB — URINALYSIS, ROUTINE W REFLEX MICROSCOPIC
Bilirubin, UA: NEGATIVE
Glucose, UA: NEGATIVE
Ketones, UA: NEGATIVE
Leukocytes,UA: NEGATIVE
Nitrite, UA: NEGATIVE
Protein,UA: NEGATIVE
RBC, UA: NEGATIVE
Specific Gravity, UA: <1.005 — ABNORMAL LOW (ref 1.005–1.030)
Urobilinogen, Ur: 0.2 mg/dL (ref 0.2–1.0)
pH, UA: 6 (ref 5.0–7.5)
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
## 2023-07-06 NOTE — Patient Instructions (Signed)
 Eating Plan for Interstitial Cystitis Interstitial cystitis (IC) is a long-term (chronic) condition that can cause pain and pressure near your bladder. It can also make you have to pee urgently and often. Symptoms may come and go. Certain foods may trigger your symptoms. Learning what foods bother you can help you come up with an eating plan to manage IC. What are tips for following this plan? You may want to work with an expert in healthy eating called a dietitian. They can help you make an eating plan by doing an elimination diet. This diet involves: Making a list of foods that you think trigger your symptoms. You may also want to include foods that often cause symptoms in other people with IC. It may take a few months to find out which foods bother you. Taking those foods out of your diet for about a month. After that month, you can try to have the foods again one at a time to see which ones cause symptoms. Reading food labels Once you know which foods trigger your symptoms, you can avoid them. But it's still a good idea to read food labels. Some foods that cause your symptoms may be ingredients in other foods. These foods may include: Soy. Worcestershire sauce. Vinegar. Alcohol. Artificial sweeteners. Monosodium glutamate. Other triggers may include: Chili peppers. Tomato products. Citrus fruits, flavors, or juices. Shopping Shopping can be hard if many foods trigger your IC. Bring a list of the foods you can't eat with you when you go to the store. You can get an app for your phone that lets you know which foods are safer and which ones you may want to avoid. You can find the app at the [employer] website: ic-network.com Meal planning Plan your meals based on the results of your elimination diet. If you haven't done the diet yet, plan meals based on IC food lists. These lists may be given to you by your health care provider or dietitian. The lists tell you which foods are least and most  likely to cause symptoms. Avoid certain types of food when you go out to eat. These may include: Pizza. [place] food. [place] food. [place] food. General information Do not eat large portions. Drink lots of fluids with your meals. Do not eat foods that are high in sugar, salt, or saturated fat. Choose whole fruits instead of juice. Eat a colorful variety of vegetables. Find ways to manage stress. Get enough exercise. What foods should I eat? For people with IC, the best diet is a balanced one. This means it includes things from all the food groups. Even if you have to avoid certain foods, there are still lots of healthy choices in each group. Below are some foods that may be safest for you to eat: Fruits Bananas. Blueberries and blueberry juice. Melons. Pears. Apples. Dates. Prunes. Raisins. Apricots. Vegetables Asparagus. Avocado. Celery. Beets. Bell peppers. Black olives. Broccoli. Brussels sprouts. Cabbage. Carrots. Cauliflower. Cucumber. Eggplant. Green beans. Potatoes. Radishes. Spinach. Squash. Turnips. Zucchini. Mushrooms. Peas. Grains Oats. Rice. Bran. Oatmeal. Whole wheat bread. Meats and other proteins Beef. Fish and other seafood. Eggs. Nuts. Peanut butter. Pork. Poultry. Lamb. Garbanzo beans. Pinto beans. Dairy Whole or low-fat milk. American, mozzarella, mild cheddar, feta, ricotta, and cream cheeses. The items listed above may not be all the foods and drinks you can have. Talk to a dietitian to learn more. What foods should I avoid? You should avoid any foods that cause symptoms. It's also a good idea to avoid  foods that often cause symptoms in many people with IC. These foods include: Fruits Citrus fruits, such as lemons, limes, oranges, and grapefruit. Cranberries. Strawberries. Pineapple. Kiwi. Vegetables Chili peppers. Onions. Sauerkraut. Tomato and tomato products. Rosita Fire. Grains You don't need to avoid any type of grain unless it causes symptoms. Meats and other  proteins Precooked or cured meats, such as sausages or meat loaves. Soy products. Dairy Chocolate ice cream. Processed cheese. Yogurt. Drinks Alcohol. Chocolate drinks. Coffee. Cranberry juice. Fizzy drinks. Black, green, or herbal tea. Tomato juice. Sports drinks. The items listed above may not be all the foods and drinks you should avoid. Talk to a dietitian to learn more. This information is not intended to replace advice given to you by your health care provider. Make sure you discuss any questions you have with your health care provider. Document Revised: 04/27/2022 Document Reviewed: 04/27/2022 Elsevier Patient Education  2024 [employer].

## 2023-07-07 LAB — BASIC METABOLIC PANEL WITH GFR
BUN/Creatinine Ratio: 23 (ref 9–23)
BUN: 20 mg/dL (ref 6–24)
CO2: 23 mmol/L (ref 20–29)
Calcium: 9.5 mg/dL (ref 8.7–10.2)
Chloride: 101 mmol/L (ref 96–106)
Creatinine, Ser: 0.86 mg/dL (ref 0.57–1.00)
Glucose: 107 mg/dL — ABNORMAL HIGH (ref 70–99)
Potassium: 4.6 mmol/L (ref 3.5–5.2)
Sodium: 143 mmol/L (ref 134–144)
eGFR: 81 mL/min/{1.73_m2} (ref >59)

## 2023-07-10 ENCOUNTER — Encounter: Payer: Self-pay | Admitting: Urology

## 2023-07-12 ENCOUNTER — Ambulatory Visit: Attending: Nurse Practitioner | Admitting: Nurse Practitioner

## 2023-07-12 ENCOUNTER — Other Ambulatory Visit: Payer: Self-pay | Admitting: Specialist

## 2023-07-12 DIAGNOSIS — G932 Benign intracranial hypertension: Secondary | ICD-10-CM

## 2023-07-12 DIAGNOSIS — G039 Meningitis, unspecified: Secondary | ICD-10-CM

## 2023-07-30 NOTE — Discharge Instructions (Signed)

## 2023-07-31 ENCOUNTER — Ambulatory Visit
Admission: RE | Admit: 2023-07-31 | Discharge: 2023-07-31 | Disposition: A | Discharge status: Home / Self Care | Source: Ambulatory Visit | Attending: Specialist | Admitting: Specialist

## 2023-07-31 VITALS — BP 154/71 | HR 72

## 2023-07-31 DIAGNOSIS — R002 Palpitations: Secondary | ICD-10-CM

## 2023-07-31 DIAGNOSIS — I272 Pulmonary hypertension, unspecified: Secondary | ICD-10-CM

## 2023-07-31 DIAGNOSIS — E559 Vitamin D deficiency, unspecified: Secondary | ICD-10-CM

## 2023-07-31 DIAGNOSIS — G932 Benign intracranial hypertension: Secondary | ICD-10-CM

## 2023-07-31 DIAGNOSIS — G039 Meningitis, unspecified: Secondary | ICD-10-CM

## 2023-07-31 NOTE — Progress Notes (Signed)
 1 vial of blood drawn from pts Right AC to be sent off with LP lab work. 1 successful attempt, pt tolerated well. Gauze and tape applied after.

## 2023-08-06 ENCOUNTER — Telehealth: Payer: Self-pay | Admitting: Nurse Practitioner

## 2023-08-06 ENCOUNTER — Telehealth: Payer: Self-pay | Admitting: Cardiology

## 2023-08-06 NOTE — Telephone Encounter (Signed)
 Pt c/o BP issue: STAT if pt c/o blurred vision, one-sided weakness or slurred speech.   1. What is your BP concern?  Diastolic is low + systolic is higher than normal.  2. Have you taken any BP medication today? Patient says she has taken Metoprolol  and HCTZ  3. What are your last 5 BP readings? 134/67 124/63  4. Are you having any other symptoms (ex. Dizziness, headache, blurred vision, passed out)?  Dizziness, headaches (thinks is unrelated + seeing neurologist)

## 2023-08-06 NOTE — Telephone Encounter (Signed)
 Pt c/o BP issue:  1. What are your last 5 BP readings? 124/63 900 am today and 137/66 11:30 am today  2. Are you having any other symptoms (ex. Dizziness, headache, blurred vision, passed out)? Dizzness  3. What is your medication issue? None noted   Message came from My Chart

## 2023-08-07 NOTE — Telephone Encounter (Signed)
    Covering for Elizabeth - BP readings are overall well-controlled with only one outlier. Diastolic readings in the 60's still technically normal. Would make sure she is staying hydrated and consuming 60-65 ounces of fluid daily (a majority of that being water ). She can keep a BP log and return in 2-3 weeks. Please review proper technique of checking BP and would not check more than twice daily.   Signed, Laymon CHRISTELLA Qua, PA-C 08/07/2023, 9:14 AM Pager: [phone]

## 2023-08-07 NOTE — Telephone Encounter (Signed)
 Patient informed and verbalized understanding of plan.

## 2023-08-07 NOTE — Telephone Encounter (Signed)
Duplicate please see other encounter

## 2023-08-07 NOTE — Telephone Encounter (Signed)
 Patient states she had a lumbar puncture last  Yesterday- 143/68  HR- 79 Prior 131/72 122/64 143/68 125/65  Symptoms? Patient states sometimes she gets headaches and a little dizzy she is currently seeing neuro regarding this.  Patient is taking all medications as prescribed. Medication list reviewed.  Patient states she is just concerned as her systolic pressure will sometimes be high and her diastolic pressure will sometimes be normal at the same time.  Advised patient I would route to provider for review

## 2023-08-16 ENCOUNTER — Other Ambulatory Visit: Payer: Self-pay | Admitting: Cardiology

## 2023-08-22 ENCOUNTER — Ambulatory Visit: Admitting: "Endocrinology

## 2023-09-14 LAB — HERPES SIMPLEX VIRUS 1/2 (IGG), CSF
HSV 1 IgG Index:: 0.09
HSV 2 IgG Index:: <0.01

## 2023-09-14 LAB — MYCOBACTERIA,CULT W/FLUOROCHROME SMEAR
MICRO NUMBER:: 16646184
SMEAR:: NONE SEEN
SPECIMEN QUALITY:: ADEQUATE

## 2023-09-14 LAB — CSF CELL COUNT WITH DIFFERENTIAL
RBC Count, CSF: 109 {cells}/uL — ABNORMAL HIGH
TOTAL NUCLEATED CELL: 2 {cells}/uL (ref 0–5)

## 2023-09-14 LAB — B. BURGDORFI ANTIBODIES, CSF: Lyme Ab: NEGATIVE

## 2023-09-14 LAB — PROTEIN, CSF: Total Protein, CSF: 43 mg/dL (ref 15–45)

## 2023-09-14 LAB — ANGIOTENSIN CONVERTING ENZYME, CSF: ANGIOTENSIN CONVERTING ENZYME ( ACE) CSF: 6 U/L (ref <=15)

## 2023-09-14 LAB — VDRL, CSF: VDRL Quant, CSF: NONREACTIVE

## 2023-09-14 LAB — CRYPTOCOCCAL AG, LTX SCR RFLX TITER
Cryptococcal Ag Screen: NOT DETECTED
MICRO NUMBER:: 16646183
SPECIMEN QUALITY:: ADEQUATE

## 2023-09-14 LAB — LYME DISEASE ABS IGG, IGM, IFA, CSF
Lyme Disease AB (IgG), IBL: NOT DETECTED
Lyme Disease AB (IgM), IBL: NOT DETECTED

## 2023-09-14 LAB — GLUCOSE, CSF: Glucose, CSF: 64 mg/dL (ref 40–80)

## 2023-09-24 ENCOUNTER — Telehealth: Payer: Self-pay | Admitting: Cardiology

## 2023-09-24 NOTE — Telephone Encounter (Signed)
 Spoke with patient regarding her symptoms. Stated that on Saturday she experienced high heart rate and some chest discomfort. Stated that her HR resting was 94-95. Felt some tightness in her chest. Took her BP about 2 hours ago today 133/79 HR 84. Is taking her Metoprolol  as prescribed. Pain or discomfort level is a 4 today. It comes and goes no matter what she does. She is drinking plenty of fluids and watching her sodium intake. Advised her of ER precautions if her symptoms worsen and will route to provider for review.

## 2023-09-24 NOTE — Telephone Encounter (Signed)
   Pt c/o of Chest Pain: STAT if active CP, including tightness, pressure, jaw pain, radiating pain to shoulder/upper arm/back, CP unrelieved by Nitro. Symptoms reported of SOB, nausea, vomiting, sweating.  1. Are you having CP right now? no    2. Are you experiencing any other symptoms (ex. SOB, nausea, vomiting, sweating)? No, states she just feels tired.    3. Is your CP continuous or coming and going? Patient states the chest discomfort  comes and goes.   4. Have you taken Nitroglycerin? No, she said she doesn't have that medication.    5. How long have you been experiencing CP? She said the chest discomfort started Saturday.     6. If NO CP at time of call then end call with telling Pt to call back or call 911 if Chest pain returns prior to return call from triage team.

## 2023-09-25 ENCOUNTER — Telehealth: Payer: Self-pay | Admitting: Nurse Practitioner

## 2023-09-25 ENCOUNTER — Ambulatory Visit: Attending: Nurse Practitioner | Admitting: Nurse Practitioner

## 2023-09-25 ENCOUNTER — Encounter: Payer: Self-pay | Admitting: Nurse Practitioner

## 2023-09-25 VITALS — BP 118/70 | HR 72 | Ht 62.0 in | Wt 288.4 lb

## 2023-09-25 DIAGNOSIS — E876 Hypokalemia: Secondary | ICD-10-CM

## 2023-09-25 DIAGNOSIS — E785 Hyperlipidemia, unspecified: Secondary | ICD-10-CM

## 2023-09-25 DIAGNOSIS — R079 Chest pain, unspecified: Secondary | ICD-10-CM | POA: Diagnosis not present

## 2023-09-25 DIAGNOSIS — I272 Pulmonary hypertension, unspecified: Secondary | ICD-10-CM

## 2023-09-25 DIAGNOSIS — R002 Palpitations: Secondary | ICD-10-CM

## 2023-09-25 DIAGNOSIS — Z87898 Personal history of other specified conditions: Secondary | ICD-10-CM

## 2023-09-25 DIAGNOSIS — R030 Elevated blood-pressure reading, without diagnosis of hypertension: Secondary | ICD-10-CM

## 2023-09-25 DIAGNOSIS — G4733 Obstructive sleep apnea (adult) (pediatric): Secondary | ICD-10-CM

## 2023-09-25 NOTE — Telephone Encounter (Signed)
 Scheduled today with peck at 3:30

## 2023-09-25 NOTE — Patient Instructions (Addendum)
 Medication Instructions:  Your physician recommends that you continue on your current medications as directed. Please refer to the Current Medication list given to you today.  Labwork: In 2-3 weeks at [facility]   Testing/Procedures:   Your cardiac CT will be scheduled at one of the below locations:  [facility] [address]  [place], [place] [address] [phone]  If scheduled at the [facility] V[facility] at [facility] street, please enter the parking lot using the [address] entrance and use the FREE valet service at the patient drop-off area. Enter the building and check-in with registration on the main floor.  If scheduled at [facility], please arrive 30 minutes early for check-in and test prep.  Please follow these instructions carefully (unless otherwise directed):  An IV will be required for this test and Nitroglycerin will be given.   On the Night Before the Test: Be sure to Drink plenty of water . Do not consume any caffeinated/decaffeinated beverages or chocolate 12 hours prior to your test. Do not take any antihistamines 12 hours prior to your test.  On the Day of the Test: Drink plenty of water  until 1 hour prior to the test. Do not eat any food 1 hour prior to test. You may take your regular medications prior to the test.  Take metoprolol  100 Mg (Lopressor ) two hours prior to test. Please HOLD Hydrochlorothiazide  on the morning of the test. Patients who wear a continuous glucose monitor MUST remove the device prior to scanning. FEMALES- please wear underwire-free bra if available, avoid dresses & tight clothing      After the Test: Drink plenty of water . After receiving IV contrast, you may experience a mild flushed feeling. This is normal. On occasion, you may experience a mild rash up to 24 hours after the test. This is not dangerous. If this occurs, you can take Benadryl 25 mg, Zyrtec, Claritin, or Allegra and  increase your fluid intake. (Patients taking Tikosyn should avoid Benadryl, and may take Zyrtec, Claritin, or Allegra) If you experience trouble breathing, this can be serious. If it is severe call 911 IMMEDIATELY. If it is mild, please call our office.  We will call to schedule your test 2-4 weeks out understanding that some insurance companies will need an authorization prior to the service being performed.   For more information and frequently asked questions, please visit our website : [email or web address]  For non-scheduling related questions, please contact the cardiac imaging nurse navigator should you have any questions/concerns: Cardiac Imaging Nurse Navigators Direct Office Dial: [phone]   For scheduling needs, including cancellations and rescheduling, please call [place], [phone].  Follow-Up: Your physician recommends that you schedule a follow-up appointment in: 6 weeks   Any Other Special Instructions Will Be Listed Below (If Applicable).  If you need a refill on your cardiac medications before your next appointment, please call your pharmacy.

## 2023-09-25 NOTE — Telephone Encounter (Signed)
 Pt c/o BP issue: STAT if pt c/o blurred vision, one-sided weakness or slurred speech.  1. What is your BP concern?  BP is high.  2. Have you taken any BP medication today? Yes at 6 AM, and again about 1 hour ago   3. What are your last 5 BP readings? 182/97 yesterday at the ED 162/82 when she left the ED Hasn't checked BP today, but plans to check it before call is returned.  4. Are you having any other symptoms (ex. Dizziness, headache, blurred vision, passed out)?  Headache, CP radiating into arm since Saturday--No chest pain currently. Did not take nitroglycerin.

## 2023-09-25 NOTE — Progress Notes (Signed)
 Office Visit    Patient Name: Rachel Holland Date of Encounter: 09/25/2023 PCP:  Maree Isles, MD [facility]  Cardiologist:  Alvan Carrier, MD  Advanced Practice Provider:  No care team member to display Electrophysiologist:  None   Chief Complaint and HPI    Rachel Holland is a 53 y.o. female with a hx of palpitations, heart murmur, HLD, pulsatile tinnitus, OSA on CPAP, and morbid obesity, who presents today for overdue follow-up.   Previous event monitor in 2019 showed sinus rhythm/sinus tach with rare PVCs, no significant arrhythmias.  Last seen by Dr. Carrier Alvan on December 07, 2021.  Her palpitations are overall stable and tolerable on Lopressor .  She had noted pulsating sensation in ears.  Carotid ultrasound benign, negative eval by ENT.  Echo revealed aortic sclerosis, mild MR.  History of intermittent fluid in ears, hence why she remain on HCTZ.  I last saw her for follow-up on July 21, 2022.  She continued to admit to stable pulsating sensation in her ears.  Noted difficulty losing weight.  Overall was doing well from a cardiac perspective.    ED visit to [facility] on February 02, 2023. She noted some dizziness where she described lightheadedness while laying down.  Another episode of dizziness while sitting up from a laying position.  Denied spinning sensation.  Also noted chest pain.  Workup was overall reassuring.   Today she presents for scheduled follow-up.  She wants to know she is due for an echocardiogram as she believes this is due to be updated.  She notes DOE as well as palpitations.  Notices it is at nighttime mainly, denies any tachycardia.  Describes palpitations sensation as hard.  Continues to note continuous/stable pulsating sensation in her ears. Denies any syncope, presyncope, dizziness, orthopnea, PND, swelling or significant weight changes, acute bleeding, or claudication.  ED visit yesterday at [facility] for  chest pain. Has also noticed high BP - see telephone note from earlier today.   09/25/2023 -tells me she had an episode of chest pain this past Saturday, 1 of is related to stress that she admits to constant state of anxiety/stress.  Her workup was reassuring.  Difficult for her to describe this episode but did admit to chest pain that also went behind her left arm.  This concerned her and prompted her to go to the ED.  She also tells me she is being followed and managed for idiopathic intracranial hypertension.  She has noticed her BP trends have been higher recently.  Admits to recent BP reading of SBP's in 190s.  Denies any recurrent chest pain. Denies any shortness of breath, palpitations, syncope, presyncope, dizziness, orthopnea, PND, swelling or significant weight changes, acute bleeding, or claudication.   EKGs/Labs/Other Studies Reviewed:   The following studies were reviewed today:   EKG:   EKG Interpretation Date/Time:  Tuesday September 25 2023 15:24:27 EDT Ventricular Rate:  71 PR Interval:  118 QRS Duration:  70 QT Interval:  372 QTC Calculation: 404 R Axis:   66  Text Interpretation: Normal sinus rhythm with sinus arrhythmia Nonspecific T wave abnormality When compared with ECG of 10-Apr-2023 08:55, No significant change was found Confirmed by Miriam Norris [phone]) on 09/25/2023 3:28:09 PM   EKG Interpretation Date/Time:  Tuesday September 25 2023 15:24:27 EDT Ventricular Rate:  71 PR Interval:  118 QRS Duration:  70 QT Interval:  372 QTC Calculation: 404 R Axis:   66  Text Interpretation: Normal  sinus rhythm with sinus arrhythmia Nonspecific T wave abnormality When compared with ECG of 10-Apr-2023 08:55, No significant change was found Confirmed by Miriam Norris [phone]) on 09/25/2023 3:28:09 PM  Echocardiogram 05/2023: 1. Left ventricular ejection fraction, by estimation, is 60 to 65%. Left  ventricular ejection fraction by 3D volume is 62 %. The left ventricle has  normal  function. The left ventricle has no regional wall motion  abnormalities. Left ventricular diastolic   parameters were normal.   2. Right ventricular systolic function is normal. The right ventricular  size is normal. There is normal pulmonary artery systolic pressure. The  estimated right ventricular systolic pressure is 35.5 mmHg.   3. The mitral valve is degenerative. Mild to moderate mitral valve  regurgitation. No evidence of mitral stenosis. The mean mitral valve  gradient is 3.0 mmHg.   4. The aortic valve is tricuspid. There is mild calcification of the  aortic valve. Aortic valve regurgitation is not visualized. Aortic valve  sclerosis/calcification is present, without any evidence of aortic  stenosis. Aortic valve mean gradient  measures 6.0 mmHg.   5. The inferior vena cava is normal in size with greater than 50%  respiratory variability, suggesting right atrial pressure of 3 mmHg.   Comparison(s): A prior study was performed on 08/01/2021. Prior images  reviewed side by side. LVEF normal range at 60-65%. Mild to moderate  mitral regurgitation.  Echo 07/2021:  1. Left ventricular ejection fraction, by estimation, is 60 to 65%. The  left ventricle has normal function. The left ventricle has no regional  wall motion abnormalities. Left ventricular diastolic parameters were  normal. The average left ventricular  global longitudinal strain is -19.3 %. The global longitudinal strain is  normal.   2. Right ventricular systolic function is normal. The right ventricular  size is mildly enlarged. There is normal pulmonary artery systolic  pressure. The estimated right ventricular systolic pressure is 33.5 mmHg.   3. The mitral valve is abnormal. Mild mitral valve regurgitation.  Moderate mitral annular calcification.   4. The aortic valve is tricuspid. Aortic valve regurgitation is not  visualized. Aortic valve sclerosis is present, with no evidence of aortic  valve stenosis.   5.  The inferior vena cava is normal in size with greater than 50%  respiratory variability, suggesting right atrial pressure of 3 mmHg.   Comparison(s): No significant change from prior study. Prior images  reviewed side by side.   Review of Systems    All other systems reviewed and are otherwise negative except as noted above.  Physical Exam    VS:  BP 118/70   Pulse 72   Ht 5' 2 (1.575 m)   Wt 288 lb 6.4 oz (130.8 kg)   SpO2 99%   BMI 52.75 kg/m  , BMI Body mass index is 52.75 kg/m.  Wt Readings from Last 3 Encounters:  09/25/23 288 lb 6.4 oz (130.8 kg)  06/15/23 291 lb 9.6 oz (132.3 kg)  04/10/23 292 lb 4.8 oz (132.6 kg)     GEN: Morbidly obese, 53 y.o. female in no acute distress. HEENT: normal. Neck: Supple, no JVD, carotid bruits, or masses. Cardiac: S1/S2, RRR, grade 1/6 murmur, no rubs, no gallops. No clubbing, cyanosis, edema.  Radials/PT 2+ and equal bilaterally.  Respiratory:  Respirations regular and unlabored, clear to auscultation bilaterally. GI: Soft, nontender, nondistended. MS: No deformity or atrophy. Skin: Warm and dry, no rash. Neuro:  Strength and sensation are intact. Psych: Normal affect.  Assessment &  Plan    History of chest pain/chest pain of uncertain etiology Past NST at [facility] in 2018 that was very low risk.  Recent limited episode, anxiety and stress seem to be contributing, she does want to know her risk.  She does have some risk factors for CAD.  Will arrange CCTA - recent kidney function normal.  No other medication changes at this time.  Instructed her regarding her medication including Lopressor .  She verbalized understanding.  Care and ED precautions discussed.  Palpitations, hypokalemia Denies any recent palpitations.  Continue Lopressor .  Continue rest of medication regimen. Heart healthy diet and regular cardiovascular exercise encouraged.  She is currently taking a potassium supplement.  We will recheck a BMET for further  evaluation.  3. Elevated BP Admits to elevated BP readings-anxiety and stress could be contributing to this.  Her blood pressure today in the office is excellent.  Instructed her to monitor BP at home at least 2 hours after medications and sitting for 5-10 minutes.  No medication changes at this time.  4. HLD LDL 93 in 10/2022. Continue atorvastatin . Heart healthy diet and regular cardiovascular exercise encouraged.  This is being managed by her endocrinologist-continue follow-up with endocrinology.  5. OSA on CPAP Encouraged continued compliance.   6. Morbid obesity  Weight loss via diet and exercise encouraged. Discussed the impact being overweight would have on cardiovascular risk.  Previously referred to healthy weight and wellness clinic.  Disposition: Follow up in 6 weeks with Alvan Carrier, MD or APP.  Signed, Almarie Crate, NP

## 2023-09-28 ENCOUNTER — Telehealth (HOSPITAL_COMMUNITY): Payer: Self-pay | Admitting: Emergency Medicine

## 2023-09-28 NOTE — Telephone Encounter (Signed)
 Reaching out to patient to offer assistance regarding upcoming cardiac imaging study; pt verbalizes understanding of appt date/time, parking situation and where to check in, pre-test NPO status and medications ordered, and verified current allergies; name and call back number provided for further questions should they arise Rockwell Alexandria RN Navigator Cardiac Imaging [facility] [phone] office [phone] cell

## 2023-10-02 ENCOUNTER — Ambulatory Visit (HOSPITAL_COMMUNITY)
Admission: RE | Admit: 2023-10-02 | Discharge: 2023-10-02 | Disposition: A | Discharge status: Home / Self Care | Source: Ambulatory Visit | Attending: Nurse Practitioner | Admitting: Nurse Practitioner

## 2023-10-02 DIAGNOSIS — R079 Chest pain, unspecified: Secondary | ICD-10-CM | POA: Insufficient documentation

## 2023-10-02 DIAGNOSIS — R931 Abnormal findings on diagnostic imaging of heart and coronary circulation: Secondary | ICD-10-CM | POA: Diagnosis present

## 2023-10-02 DIAGNOSIS — I2510 Atherosclerotic heart disease of native coronary artery without angina pectoris: Secondary | ICD-10-CM

## 2023-10-02 DIAGNOSIS — Z87898 Personal history of other specified conditions: Secondary | ICD-10-CM

## 2023-10-02 MED ORDER — IOHEXOL 350 MG/ML SOLN
100.0000 mL | Freq: Once | INTRAVENOUS | Status: AC | PRN
  Administered 2023-10-02: 140 mL via INTRAVENOUS

## 2023-10-02 MED ORDER — NITROGLYCERIN 0.4 MG SL SUBL
0.8000 mg | SUBLINGUAL_TABLET | Freq: Once | SUBLINGUAL | Status: AC
  Administered 2023-10-02: 0.8 mg via SUBLINGUAL

## 2023-10-03 ENCOUNTER — Other Ambulatory Visit: Payer: Self-pay | Admitting: Cardiology

## 2023-10-03 ENCOUNTER — Emergency Department (HOSPITAL_COMMUNITY)

## 2023-10-03 ENCOUNTER — Encounter (HOSPITAL_COMMUNITY): Payer: Self-pay

## 2023-10-03 ENCOUNTER — Telehealth: Payer: Self-pay | Admitting: Cardiology

## 2023-10-03 ENCOUNTER — Emergency Department (HOSPITAL_COMMUNITY)
Admission: EM | Admit: 2023-10-03 | Discharge: 2023-10-03 | Disposition: A | Discharge status: Home / Self Care | Source: Ambulatory Visit | Attending: Emergency Medicine | Admitting: Emergency Medicine

## 2023-10-03 ENCOUNTER — Other Ambulatory Visit: Payer: Self-pay

## 2023-10-03 ENCOUNTER — Telehealth: Payer: Self-pay

## 2023-10-03 ENCOUNTER — Ambulatory Visit (HOSPITAL_COMMUNITY)
Admission: RE | Admit: 2023-10-03 | Discharge: 2023-10-03 | Disposition: A | Discharge status: Home / Self Care | Source: Ambulatory Visit | Attending: Cardiology | Admitting: Cardiology

## 2023-10-03 DIAGNOSIS — Z7982 Long term (current) use of aspirin: Secondary | ICD-10-CM | POA: Insufficient documentation

## 2023-10-03 DIAGNOSIS — R42 Dizziness and giddiness: Secondary | ICD-10-CM | POA: Insufficient documentation

## 2023-10-03 DIAGNOSIS — R931 Abnormal findings on diagnostic imaging of heart and coronary circulation: Secondary | ICD-10-CM

## 2023-10-03 DIAGNOSIS — E876 Hypokalemia: Secondary | ICD-10-CM | POA: Diagnosis not present

## 2023-10-03 DIAGNOSIS — R079 Chest pain, unspecified: Secondary | ICD-10-CM | POA: Insufficient documentation

## 2023-10-03 DIAGNOSIS — R0602 Shortness of breath: Secondary | ICD-10-CM | POA: Insufficient documentation

## 2023-10-03 DIAGNOSIS — D72829 Elevated white blood cell count, unspecified: Secondary | ICD-10-CM | POA: Diagnosis not present

## 2023-10-03 LAB — TROPONIN I (HIGH SENSITIVITY)
Troponin I (High Sensitivity): 2 ng/L (ref <18)
Troponin I (High Sensitivity): 4 ng/L (ref <18)

## 2023-10-03 LAB — CBC
HCT: 44.4 % (ref 36.0–46.0)
Hemoglobin: 14.9 g/dL (ref 12.0–15.0)
MCH: 30.3 pg (ref 26.0–34.0)
MCHC: 33.6 g/dL (ref 30.0–36.0)
MCV: 90.4 fL (ref 80.0–100.0)
Platelets: 323 K/uL (ref 150–400)
RBC: 4.91 MIL/uL (ref 3.87–5.11)
RDW: 13.2 % (ref 11.5–15.5)
WBC: 12.5 K/uL — ABNORMAL HIGH (ref 4.0–10.5)
nRBC: 0 % (ref 0.0–0.2)

## 2023-10-03 LAB — D-DIMER, QUANTITATIVE: D-Dimer, Quant: 0.53 ug{FEU}/mL — ABNORMAL HIGH (ref 0.00–0.50)

## 2023-10-03 LAB — BASIC METABOLIC PANEL WITH GFR
Anion gap: 15 (ref 5–15)
BUN: 12 mg/dL (ref 6–20)
CO2: 26 mmol/L (ref 22–32)
Calcium: 9.2 mg/dL (ref 8.9–10.3)
Chloride: 98 mmol/L (ref 98–111)
Creatinine, Ser: 0.98 mg/dL (ref 0.44–1.00)
GFR, Estimated: >60 mL/min (ref >60)
Glucose, Bld: 119 mg/dL — ABNORMAL HIGH (ref 70–99)
Potassium: 3 mmol/L — ABNORMAL LOW (ref 3.5–5.1)
Sodium: 139 mmol/L (ref 135–145)

## 2023-10-03 MED ORDER — POTASSIUM CHLORIDE CRYS ER 20 MEQ PO TBCR: 20.0000 meq | EXTENDED_RELEASE_TABLET | Freq: Two times a day (BID) | ORAL | 0 refills | Status: AC

## 2023-10-03 MED ORDER — POTASSIUM CHLORIDE CRYS ER 20 MEQ PO TBCR
40.0000 meq | EXTENDED_RELEASE_TABLET | Freq: Once | ORAL | Status: AC
  Administered 2023-10-03: 40 meq via ORAL
  Filled 2023-10-03: qty 2

## 2023-10-03 MED ORDER — METOPROLOL TARTRATE 25 MG PO TABS
12.5000 mg | ORAL_TABLET | Freq: Once | ORAL | Status: AC
  Administered 2023-10-03: 12.5 mg via ORAL
  Filled 2023-10-03: qty 1

## 2023-10-03 MED ORDER — IOHEXOL 350 MG/ML SOLN
75.0000 mL | Freq: Once | INTRAVENOUS | Status: AC | PRN
  Administered 2023-10-03: 75 mL via INTRAVENOUS

## 2023-10-03 NOTE — Telephone Encounter (Signed)
 Please refer to phone note from today for complete details.   Littie CHRISTELLA Croak, CMA 10/03/2023 9:33 AM

## 2023-10-03 NOTE — Telephone Encounter (Signed)
 Spoke with patient outside in the car. Patient was having chest discomfort and SOB. Had Coronary CTA yesterday. Patient reviewed results on her MyChart concerned about the results she was seeing. Spoke with Clinical Supervisor regarding symptoms and she advised as well patient needed to go to ER. Recommended Rachel Holland so that one of our doctors will be able to see her there. Patient's husband will drive her to the ER. Routed to [place] so she is aware.

## 2023-10-03 NOTE — Discharge Instructions (Signed)
 Please call and follow-up with the cardiologist

## 2023-10-03 NOTE — Telephone Encounter (Signed)
 Pt c/o medication issue:  1. Name of Medication: hydrochlorothiazide  (HYDRODIURIL ) 12.5 MG tablet   2. How are you currently taking this medication (dosage and times per day)?   Take 1 tablet (12.5 mg total) by mouth daily.    3. Are you having a reaction (difficulty breathing--STAT)? No  4. What is your medication issue? Patient was told to hold this medication because she had a CT Coronary Morph completed. Patient would like to know when she can resume the medication. Please advise.

## 2023-10-03 NOTE — Telephone Encounter (Signed)
 Advised patient that she can start hydrochlorothiazide  was only to hold the morning of her test. Patient verbalized understanding.

## 2023-10-03 NOTE — ED Provider Notes (Signed)
 [place] EMERGENCY DEPARTMENT AT [facility] Provider Note   CSN: [id number] Arrival date & time: 10/03/23  1624     Patient presents with: Chest Pain, Dizziness, and Shortness of Breath   Rachel Holland is a 53 y.o. female with a history of obesity presenting to the ED with concern for chest pressure, shortness of breath, dizziness.  Patient reports has had chest pressure for several weeks.  She has this often at rest.  She says today she was on her way to her doctor's office and began to feel very short of breath and dizzy.  Outside in the parking lot of the doctor's office she was told to come to the ED.  She says she is very anxious and concerned because she saw the results of her coronary CT scan yesterday and thought they might be concerning.  I reviewed the coronary CT scan.  The patient is known to have no significant stenosis of any major blood vessel.  She is however in the 99 percentile for coronary calcium  score  She denies any unilateral leg or calf pain or swelling.  Denies history of DVT or PE.  He does report a history of anxiety.   HPI     Prior to Admission medications   Medication Sig Start Date End Date Taking? Authorizing Provider  acetaminophen  (TYLENOL ) 500 MG tablet Take 1,000 mg by mouth every 6 (six) hours as needed for mild pain (pain score 1-3).   Yes [provider]  Ascorbic Acid (VITAMIN C PO) Take 1 tablet by mouth daily.   Yes [provider]  aspirin EC 81 MG tablet Take 4 tablets by mouth every 6 (six) hours as needed (chest pain). Swallow whole.   Yes [provider]  atorvastatin  (LIPITOR) 20 MG tablet Take 1 tablet by mouth once daily 08/17/23  Yes Branch, Dorn FALCON, MD  clonazePAM (KLONOPIN) 0.5 MG tablet Take 0.5 mg by mouth daily as needed for anxiety.    Yes [provider]  hydrochlorothiazide  (HYDRODIURIL ) 12.5 MG tablet Take 1 tablet (12.5 mg total) by mouth daily. 07/21/22  Yes Miriam Norris,  NP  metoprolol  tartrate (LOPRESSOR ) 25 MG tablet Take 0.5 tablets (12.5 mg total) by mouth in the morning, at noon, in the evening, and at bedtime. Take an extra 12.5 Mg tablet nightly 04/10/23  Yes Miriam Norris, NP  pantoprazole  (PROTONIX ) 40 MG tablet Take 40 mg by mouth daily. 05/04/21  Yes [provider]  potassium chloride  SA (KLOR-CON  M) 20 MEQ tablet Take 20 mEq by mouth 2 (two) times daily.   Yes [provider]  potassium chloride  SA (KLOR-CON  M) 20 MEQ tablet Take 1 tablet (20 mEq total) by mouth 2 (two) times daily for 10 days. 10/03/23 10/13/23 Yes Savonna Birchmeier, Donnice PARAS, MD    Allergies: Sulfa antibiotics and Doxycycline    Review of Systems  Updated Vital Signs BP 129/62 (BP Location: Right Arm)   Pulse 72   Temp 97.9 F (36.6 C) (Oral)   Resp 15   Ht 5' 2 (1.575 m)   Wt 130 kg   SpO2 98%   BMI 52.42 kg/m   Physical Exam Constitutional:      General: She is not in acute distress.    Appearance: She is obese.  HENT:     Head: Normocephalic and atraumatic.  Eyes:     Conjunctiva/sclera: Conjunctivae normal.     Pupils: Pupils are equal, round, and reactive to light.  Cardiovascular:  Rate and Rhythm: Normal rate and regular rhythm.  Pulmonary:     Effort: Pulmonary effort is normal. No respiratory distress.  Abdominal:     General: There is no distension.     Tenderness: There is no abdominal tenderness.  Skin:    General: Skin is warm and dry.  Neurological:     General: No focal deficit present.     Mental Status: She is alert. Mental status is at baseline.  Psychiatric:        Mood and Affect: Mood normal.        Behavior: Behavior normal.     (all labs ordered are listed, but only abnormal results are displayed) Labs Reviewed  BASIC METABOLIC PANEL WITH GFR - Abnormal; Notable for the following components:      Result Value   Potassium 3.0 (*)    Glucose, Bld 119 (*)    All other components within normal limits  CBC - Abnormal;  Notable for the following components:   WBC 12.5 (*)    All other components within normal limits  D-DIMER, QUANTITATIVE - Abnormal; Notable for the following components:   D-Dimer, Quant 0.53 (*)    All other components within normal limits  TROPONIN I (HIGH SENSITIVITY)  TROPONIN I (HIGH SENSITIVITY)    EKG: None  Radiology: CT Angio Chest PE W and/or Wo Contrast Result Date: 10/03/2023 CLINICAL DATA:  Concern for pulmonary embolism. EXAM: CT ANGIOGRAPHY CHEST WITH CONTRAST TECHNIQUE: Multidetector CT imaging of the chest was performed using the standard protocol during bolus administration of intravenous contrast. Multiplanar CT image reconstructions and MIPs were obtained to evaluate the vascular anatomy. RADIATION DOSE REDUCTION: This exam was performed according to the departmental dose-optimization program which includes automated exposure control, adjustment of the mA and/or kV according to patient size and/or use of iterative reconstruction technique. CONTRAST:  75mL OMNIPAQUE  IOHEXOL  350 MG/ML SOLN COMPARISON:  Chest radiograph dated 10/03/2023. FINDINGS: Cardiovascular: There is no cardiomegaly or pericardial effusion. The thoracic aorta is unremarkable. The origins of the great vessels of the aortic arch appear patent. Evaluation of the pulmonary arteries is limited due to respiratory motion. No pulmonary artery embolus identified. Mediastinum/Nodes: No hilar or mediastinal adenopathy. Small hiatal hernia. The esophagus is grossly unremarkable. No mediastinal fluid collection. Lungs/Pleura: No focal consolidation, pleural effusion or pneumothorax. The central airways are patent. Upper Abdomen: Fatty appearing liver. 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 Musculoskeletal: No acute osseous pathology. Review of the MIP images confirms the above findings. IMPRESSION: 1. No acute intrathoracic pathology. No CT evidence of pulmonary artery  embolus. 2. Fatty liver. Electronically Signed   By: Vanetta Chou M.D.   On: 10/03/2023 20:26   DG Chest Port 1 View Result Date: 10/03/2023 CLINICAL DATA:  Dizziness and chest pain. EXAM: PORTABLE CHEST 1 VIEW COMPARISON:  Cardiac CT yesterday FINDINGS: The heart is enlarged. Mediastinal contours are normal. Mild bronchial thickening. No focal airspace disease, pleural fluid or pneumothorax. No acute osseous abnormalities are seen. IMPRESSION: Cardiomegaly. Mild bronchial thickening. Electronically Signed   By: Andrea Gasman M.D.   On: 10/03/2023 18:09   CT CORONARY FRACTIONAL FLOW RESERVE FLUID ANALYSIS Result Date: 10/03/2023 EXAM: FFRCT ANALYSIS FINDINGS: FFRct analysis was performed on the original cardiac CT angiogram dataset. Diagrammatic representation of the FFRct analysis is provided in a separate PDF document in PACS. This dictation was created using the PDF document and an interactive 3D model of the results. 3D  model is not available in the EMR/PACS. Normal FFR range is >0.80. 1. Left Main: No significant stenosis. FFR 0.98. 2. LAD: No significant stenosis. Distal FFR 0.85 3. LCx: No significant stenosis. Mid FFR 0.97. The distal LCx/OM1 were not modeled. 4. RCA: No significant stenosis. Distal FFR 0.93. IMPRESSION: 1. Coronary CTA FFR analysis demonstrates no hemodynamically flow limiting lesions in vessels evaluated. The distal LCx/OM1 were not modeled. 2. Consider Stress Pet CT to assess for ischemia in the distal LCx/OM1 as well as for microvascular disease as a cause of chest pain. Wilbert Bihari, MD Electronically Signed   By: Wilbert Bihari M.D.   On: 10/03/2023 12:14   CT CORONARY MORPH W/CTA COR W/SCORE W/CA W/CM &/OR WO/CM Result Date: 10/03/2023 CLINICAL DATA:  Chest pain EXAM: Cardiac/Coronary CTA TECHNIQUE: A non-contrast, gated CT scan was obtained with axial slices of 3 mm through the heart for calcium  scoring. Calcium  scoring was performed using the Agatston method. A 120 kV  prospective, gated, contrast cardiac scan was obtained. Gantry rotation speed was 250 msecs and collimation was 0.6 mm. Two sublingual nitroglycerin  tablets (0.8 mg) were given. The 3D data set was reconstructed in 5% intervals of the 35-75% of the R-R cycle. Diastolic phases were analyzed on a dedicated workstation using MPR, MIP, and VRT modes. The patient received 95 cc of contrast. FINDINGS: Image quality: Excellent. Noise artifact is: Limited. Coronary Arteries:  Normal coronary origin.  Right dominance. Left main: The left main is a large caliber vessel with a normal take off from the left coronary cusp that bifurcates to form a left anterior descending artery and a left circumflex artery. There is no plaque or stenosis. Left anterior descending artery: The LAD gives off 2 patent diagonal branches. There is mild calcified plaque in the ostial and proximal LAD (25-49%). There is moderate calcific plaque in mid LAD with associated stenosis of 50-69%. Left circumflex artery: The LCX is non-dominant and gives off 1 obtuse marginal branch. There is minimal calcified plaque in the proximal LCx (<25%). There is mild soft plaque in the proximal OM1 (25-49%). The mid to distal LCx and OM1 are not visualized. Right coronary artery: The RCA is dominant with normal take off from the right coronary cusp. There is minimal mixed plaque in the proximal and mid RCA (<25%). The RCA terminates as a PDA and right posterolateral branch without evidence of plaque or stenosis. Right Atrium: Right atrial size is within normal limits. Right Ventricle: The right ventricular cavity is within normal limits. Left Atrium: Left atrial size is normal in size with no left atrial appendage filling defect. Left Ventricle: The ventricular cavity size is within normal limits. Pulmonary arteries: Normal in size. Pulmonary veins: Normal pulmonary venous drainage. Pericardium: Normal thickness without significant effusion or calcium  present. Cardiac  valves: The aortic valve is trileaflet without significant calcification. The mitral valve is normal without significant calcification. Aorta: Normal caliber without significant disease. Extra-cardiac findings: See attached radiology report for non-cardiac structures. IMPRESSION: 1. Coronary calcium  score of 415. This was 99th percentile for age-, sex, and race-matched controls. 2. Total plaque volume 534 mm3 which is 93rd percentile for age- and sex-matched controls (calcified plaque 60mm3; non-calcified plaque 464mm3). TPV is severe. 3. Normal coronary origin with right dominance. 4. Moderate atherosclerosis: 25-49% ostial/prox LAD and prox OM1; 50-69% mid LAD. The mid to distal LCx and OM1 are not visualized. 5. This study has been submitted for FFR analysis of the LAD. RECOMMENDATIONS: 1. CAD-RADS 0: No evidence  of CAD (0%). Consider non-atherosclerotic causes of chest pain. 2. CAD-RADS 1: Minimal non-obstructive CAD (0-24%). Consider non-atherosclerotic causes of chest pain. Consider preventive therapy and risk factor modification. 3. CAD-RADS 2: Mild non-obstructive CAD (25-49%). Consider non-atherosclerotic causes of chest pain. Consider preventive therapy and risk factor modification. 4. CAD-RADS 3: Moderate stenosis. Consider symptom-guided anti-ischemic pharmacotherapy as well as risk factor modification per guideline directed care. Additional analysis with CT FFR will be submitted. 5. CAD-RADS 4: Severe stenosis. (70-99% or > 50% left main). Cardiac catheterization or CT FFR is recommended. Consider symptom-guided anti-ischemic pharmacotherapy as well as risk factor modification per guideline directed care. Invasive coronary angiography recommended with revascularization per published guideline statements. 6. CAD-RADS 5: Total coronary occlusion (100%). Consider cardiac catheterization or viability assessment. Consider symptom-guided anti-ischemic pharmacotherapy as well as risk factor modification per  guideline directed care. 7. CAD-RADS N: Non-diagnostic study. Obstructive CAD can't be excluded. Alternative evaluation is recommended. Wilbert Bihari, MD Electronically Signed   By: Wilbert Bihari M.D.   On: 10/03/2023 12:05     Procedures   Medications Ordered in the ED  potassium chloride  SA (KLOR-CON  M) CR tablet 40 mEq (40 mEq Oral Given 10/03/23 1817)  iohexol  (OMNIPAQUE ) 350 MG/ML injection 75 mL (75 mLs Intravenous Contrast Given 10/03/23 1925)  metoprolol  tartrate (LOPRESSOR ) tablet 12.5 mg (12.5 mg Oral Given 10/03/23 2025)    Clinical Course as of 10/03/23 2313  Wed Oct 03, 2023  2134 Discussed patient's workup with her and her husband.  This is reassuring.  I discussed the case with the on-call cardiologist as well who felt that the patient's recent coronary CT scan was reassuring with no significant stenosis or blockage, noting of course that she does have a high calcium  score.  He suggested she may need an outpatient workup for microvascular disease but no emergent inpatient workup on this front.  We will replenish her potassium at home and start her back on supplements.  She will reach out to her cardiology office to follow-up on this issue.  The patient is reassured and comfortable going home [MT]    Clinical Course User Index [MT] [place] Vandergriff, Donnice PARAS, MD                                 Medical Decision Making Amount and/or Complexity of Data Reviewed Labs: ordered. Radiology: ordered.  Risk Prescription drug management.   This patient presents to the ED with concern for chest pressure, shortness of breath, lightheadedness. This involves an extensive number of treatment options, and is a complaint that carries with it a high risk of complications and morbidity.  The differential diagnosis includes anemia versus arrhythmia versus anxiety versus atypical ACS versus other  Co-morbidities that complicate the patient evaluation: Obesity and high blood pressure as cardiovascular risk  factors  Additional history obtained from patient's husband at bedside  External records from outside source obtained and reviewed including coronary CT scan report  I ordered and personally interpreted labs.  The pertinent results include: Mild hypokalemia.  Troponin negative  I ordered imaging studies including x-ray of the chest, CT PE I independently visualized and interpreted imaging which showed no emergent findings, incidental fatty liver noted I agree with the radiologist interpretation  The patient was maintained on a cardiac monitor.  I personally viewed and interpreted the cardiac monitored which showed an underlying rhythm of: No emergent findings  Per my interpretation the patient's ECG shows  sinus rhythm with no acute ischemia  I ordered medication including oral potassium for hypokalemia  I have reviewed the patients home medicines and have made adjustments as needed  Hospitalization was considered, given the patient had minimal discomfort and symptoms, and a reassuring workup in the ED, as well as per my discussion with the cardiologist, I thought it was reasonable her to follow-up as an outpatient with her cardiologist.    Disposition:  After consideration of the diagnostic results and the patients response to treatment, I feel that the patent would benefit from close outpatient follow-up      Final diagnoses:  Hypokalemia  Chest pain, unspecified type    ED Discharge Orders          Ordered    potassium chloride  SA (KLOR-CON  M) 20 MEQ tablet  2 times daily        10/03/23 2134               Cottie Donnice PARAS, MD 10/03/23 2313

## 2023-10-03 NOTE — ED Triage Notes (Signed)
 Pt c/o dizziness and chest pain. Pt had visited her dr office in [place], CT agiogram resulted, in which she was told to come straight to the emergency department.

## 2023-10-03 NOTE — Telephone Encounter (Signed)
 Patient's husband walked into the office stating that his wife is in the car with shortness of breath. He is wanting someone to walk out and advise what to do with her.

## 2023-10-04 ENCOUNTER — Telehealth: Payer: Self-pay

## 2023-10-04 NOTE — Telephone Encounter (Signed)
 Spoke with patient regarding her ER visit yesterday. She was advised she needed to have a follow up and go over her test results from Coronary CTA. Patient was having SOB and some chest tightness as well. Routed to [facility] and schedulers and let the patient know she would be called as soon as there was an opening. Patient verbalized understanding

## 2023-10-04 NOTE — Progress Notes (Incomplete Revision)
 Cardiology Office Note:  .   Date:  10/05/2023  ID:  Rachel Holland, DOB [birth year], MRN [id number] PCP: Maree Isles, MD  [facility] Providers Cardiologist:  Alvan Carrier, MD {  History of Present Illness: Rachel Holland is a 53 y.o. female  with PMHx of atypical chest pain (10/2023 CAC score of 415 with 99 percentile and moderate atherosclerosis, FFR with no significant stenosis), Palpitations (Zio 2019: NSR/sinus tach with rare PVCs), heart murmur, HDL, pulsatile tinnitus, OSA on CPAP, morbid obesity, idiopathic intracranial hypertension who reports to [facility] office for follow up.   Last seen in heartcare OV 09/25/2023 by Almarie Crate, NP for scheduled follow-up and recent ED visit the day prior for chest pain and high BP.  Reported 1 episode of chest pain that she related to stress and admitted to constant state of anxiety/stress.  ED workup was reassuring.  Reported recent BP readings of SBP in 190s, her BP in office today was normal.  Recommended proper BP monitoring techniques. Also noted being followed and managed for idiopathic intracranial hypertension.  Ordered CCTA and  BMP.  Continued on Lipitor 20 mg daily, HCTZ 12.5 mg daily, Lopressor  12.5 mg 4 times daily, and KCl 20 mEq twice daily.   CTA 10/02/2023 showed CAC score of 415 with 99 percentile with moderate atherosclerosis including 25 to 49% in ostial/proximal LAD as well as proximal OM1, 50 to 69% mid LAD, and mid to distal LCx and OM1 were not visualized.  FFR analysis showed no significant stenosis with left main 0.98, distal LAD 0.85, mid LCx 0.97, RCA 0.93, however distal LCx/RCA 1 were not visualized.  Recommended considering stress PET CT to assess for ischemia in the distal LCx/OM1 as well as for microvascular disease as cause of chest pain.  Recent ED visit 10/03/2023 for concerns of chest pressure, SOB and dizziness.  Also noted was very anxious and concern over results of coronary CT scan.  Results were  reviewed with patient.  Negative troponin. EKG without any ischemic.  CXR noted cardiomegaly and mild bronchial thickening.  Dimer elevated to 0.53 with follow-up CTA negative for PE.  Labs noted hypokalemia K 3 and discharged with KCl 20 mEq twice daily.  Today, reports ongoing chronic unchanged CP and shortness of breath associated with nausea, vomiting, and dizziness.  Chest pain described as constant heavy 3 out of 10, located midsternal, no radiation, worse with exertion and no alleviating factors.  SOB aches 1 to 2 weeks associated with minimal exertion and relieved with rest.  Also notes increased frequency of palpitations occurring daily for approximately 30 minutes described as pounding.  Denies any edema, orthopnea, and syncope.  Patient does not exercise and is not very active.  She notes 1 recent episode of elevated HR of 132 when walking in the grocery store last week.  She is very concerned about CCTA results.  During recent ED visit, she spoke with cardiologist who recommended considering catheterization with her results. Reports compliance with medications. Denies tobacco use/alcohol/drug use   ROS: 10 point review of system has been reviewed and considered negative except ones been listed in the HPI.   Studies Reviewed: Rachel   ECHO IMPRESSIONS 05/02/2023 Sonographer Comments: Global longitudinal strain was attempted.   1. Left ventricular ejection fraction, by estimation, is 60 to 65%. Left  ventricular ejection fraction by 3D volume is 62 %. The left ventricle has  normal function. The left ventricle has no regional wall motion  abnormalities.  Left ventricular diastolic   parameters were normal.   2. Right ventricular systolic function is normal. The right ventricular  size is normal. There is normal pulmonary artery systolic pressure. The  estimated right ventricular systolic pressure is 35.5 mmHg.   3. The mitral valve is degenerative. Mild to moderate mitral valve  regurgitation. No  evidence of mitral stenosis. The mean mitral valve  gradient is 3.0 mmHg.   4. The aortic valve is tricuspid. There is mild calcification of the  aortic valve. Aortic valve regurgitation is not visualized. Aortic valve  sclerosis/calcification is present, without any evidence of aortic  stenosis. Aortic valve mean gradient  measures 6.0 mmHg.   5. The inferior vena cava is normal in size with greater than 50%  respiratory variability, suggesting right atrial pressure of 3 mmHg.   Comparison(s): A prior study was performed on 08/01/2021. Prior images  reviewed side by side. LVEF normal range at 60-65%. Mild to moderate  mitral regurgitation.   Cardiac CT IMPRESSION 10/02/2023 1. Coronary calcium  score of 415. This was 99th percentile for age-, sex, and race-matched controls. 2. Total plaque volume 534 mm3 which is 93rd percentile for age- and sex-matched controls (calcified plaque 59mm3; non-calcified plaque 478mm3). TPV is severe. 3. Normal coronary origin with right dominance. 4. Moderate atherosclerosis: 25-49% ostial/prox LAD and prox OM1; 50-69% mid LAD. The mid to distal LCx and OM1 are not visualized. 5. This study has been submitted for FFR analysis of the LAD.   RECOMMENDATIONS: 1. CAD-RADS 0: No evidence of CAD (0%). Consider non-atherosclerotic causes of chest pain. 2. CAD-RADS 1: Minimal non-obstructive CAD (0-24%). Consider non-atherosclerotic causes of chest pain. Consider preventive therapy and risk factor modification. 3. CAD-RADS 2: Mild non-obstructive CAD (25-49%). Consider non-atherosclerotic causes of chest pain. Consider preventive therapy and risk factor modification. 4. CAD-RADS 3: Moderate stenosis. Consider symptom-guided anti-ischemic pharmacotherapy as well as risk factor modification per guideline directed care. Additional analysis with CT FFR will be submitted 5. CAD-RADS 4: Severe stenosis. (70-99% or > 50% left main). Cardiac catheterization or CT FFR  is recommended. Consider symptom-guided anti-ischemic pharmacotherapy as well as risk factor modification per guideline directed care. Invasive coronary angiography recommended with revascularization per published guideline statements. 6. CAD-RADS 5: Total coronary occlusion (100%). Consider cardiac catheterization or viability assessment. Consider symptom-guided anti-ischemic pharmacotherapy as well as risk factor modification per guideline directed care. 7. CAD-RADS N: Non-diagnostic study. Obstructive CAD can't be excluded. Alternative evaluation is recommended.  FFRCT analysis 10/03/2023 Normal FFR range is >0.80. 1. Left Main: No significant stenosis. FFR 0.98. 2. LAD: No significant stenosis. Distal FFR 0.85 3. LCx: No significant stenosis. Mid FFR 0.97. The distal LCx/OM1 were not modeled. 4. RCA: No significant stenosis. Distal FFR 0.93.   IMPRESSION: 1. Coronary CTA FFR analysis demonstrates no hemodynamically flow limiting lesions in vessels evaluated. The distal LCx/OM1 were not modeled. 2. Consider Stress Pet CT to assess for ischemia in the distal LCx/OM1 as well as for microvascular disease as a cause of chest pain.  Physical Exam:   VS:  There were no vitals taken for this visit.   Wt Readings from Last 3 Encounters:  10/03/23 286 lb 9.6 oz (130 kg)  09/25/23 288 lb 6.4 oz (130.8 kg)  06/15/23 291 lb 9.6 oz (132.3 kg)    GEN: Well nourished, well developed in no acute distress while sitting in chair. Anxious  NECK: No JVD; No carotid bruits CARDIAC: RRR, no murmurs, rubs, gallops RESPIRATORY:  Clear to auscultation  without rales, wheezing or rhonchi  ABDOMEN: Soft, non-tender, non-distended EXTREMITIES:  No edema; No deformity   ASSESSMENT AND PLAN: .   Chest pain of uncertain etiology History of chest pain Abnormal findings on diagnostic imaging of heart/coronary circulation 05/02/2023 showed 60 to 65%, mild to moderate MV regurgitation, AV  sclerosis/calcification w/o AS.  CTA 10/02/2023 showed CAC score of 415 with 99 percentile with moderate atherosclerosis including 25 to 49% in ostial/proximal LAD as well as proximal OM1, 50 to 69% mid LAD, and mid to distal LCx and OM1 were not visualized.   FFR analysis showed no significant stenosis with left main 0.98, distal LAD 0.85, mid LCx 0.97, RCA 0.93, however distal LCx/RCA 1 were not visualized.  Recommended considering stress PET CT to assess for ischemia in the distal LCx/OM1 as well as for microvascular disease as cause of chest pain. Reports chronic unchanged CP and SOB associated with N/V and dizziness. She is very concerned about CCTA results.  During recent ED visit, she spoke with cardiologist who recommended considering catheterization with her results.  Reviewed results as above.  Discussed stress PET as the next step option based on the report to evaluate microvascular disease and artery is not visualized.  Discussed that microvascular disease cannot be imaged on CT or cath.  Also shared webpage for more details on stress PET.  Patient is very adamant about proceeding with cath.  Discussed with results not showing any severe disease and recommending PET scan it is less likely that insurance will cover cath. Informed patient that this would have to be discussed with Dr. Alvan before moving forward.  Patient would prefer to proceed with cath if Dr. Alvan agrees, however if he does not agree then is open to stress PET. Per Dr. Alvan review of ER note and results, 3 different cardiologists including on-call cardiologist and ER, Dr. Shlomo who read the CTA and Dr. Alvan recommended PET scanning for outpatient microvascular disease.  CT results showed the large arteries without any significant blockage.  PET scan would show large arteries better as well as small arteries that do not show up on CT or cath.  Cath would not offer any additional information with the risk outweighing benefit.   Consulted with pharmacist team about antianginal medication in the setting of idiopathic intracranial hypertension. Per Allean Mink, recommended Ranexa  even though not great for acute CP but might decrease baseline CP.  Called back patient to discuss recommendations from Dr. Armida and pharmacist as above.  Reviewed the risk of cath versus stress PET.  Patient is aware and understands.  Patient agrees to proceed with cardiac PET as recommended by Dr. Alvan.  Order cardiac PET.  Patient also agrees to Ranexa .  Order Ranexa  500 mg twice daily Suspect anxiety is a contributing factor, however with known CAD as above additional ischemic evaluation is indicated. Strongly recommended to follow up with PCP for consideration of daily anxiety medication. She is scheduled for follow up with PCP next week.   Palpitations Zio 2019: NSR/sinus tach with rare PVCs Reports increased frequency of palpitations occurring daily for approximately 30 minutes described as pounding.   Order ZIO X 2 weeks Continue on Lopressor  12.5 mg 4 times daily  Hyperlipidemia LDL goal <55 Prior to CCTA results, patient LDL goal was < 100.  Currently with known CAD as above, patient is new LDL goal <70.  Discussed with patient. LDL 93 in 10/2022. Order FLP, Lpa Increase Lipitor from 20 mg to 40 mg daily.  Will plan for repeat FLP and LFTs for 12/05/2023 Managed by her endocrinologist.  Encouraged to continue to follow-up.  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 Discussed with Lipid clinic. They agreed to proceed with Repatha. Recommended LDL goal of < 55 due to being very high risk for ASCVD with extensive CAD on CT scan, high non-calcified plaque volume, young age and elevated LP(a). Order referral to PharmD clinic for Repatha. Patient was also referred to Lipid Clinic as well.   Hypokalemia  ED 10/03/2023: K 3 and discharged with KCl 20 mEq twice daily Order BMP to reassess K.   OSA on CPAP Reports  daily compliance with CPAP. Encouraged continued compliance  Morbid obesity (HCC) Encouraged weight loss via diet and exercise. Discussed the impact of being overweight in relation to cardiovascular risk.  Pulsatile tinnitus Noted since 2023. Carotid ultrasound benign 05/2021 and negative eval by ENT History of intermittent fluid in ears. Continue on hydrochlorothiazide  12.5 mg daily    Informed Consent   Shared Decision Making/Informed Consent{  The risks [chest pain, shortness of breath, cardiac arrhythmias, dizziness, blood pressure fluctuations, myocardial infarction, stroke/transient ischemic attack, nausea, vomiting, allergic reaction, radiation exposure, metallic taste sensation and life-threatening complications (estimated to be 1 in 10,000)], benefits (risk stratification, diagnosing coronary artery disease, treatment guidance) and alternatives of a cardiac PET stress test were discussed in detail with Ms. Koury and she agrees to proceed.     Dispo:  Follow up in 6-8 weeks with Dr. Alvan.   Signed, Lorette CINDERELLA Kapur, PA-C

## 2023-10-04 NOTE — Progress Notes (Addendum)
 Cardiology Office Note:  .   Date:  10/05/2023  ID:  Ronal KANDICE Bunker, DOB [birth year], MRN [id number] PCP: Maree Isles, MD  [facility] Providers Cardiologist:  Alvan Carrier, MD {  History of Present Illness: SABRA   CLAUDINA OLIPHANT is a 53 y.o. female  with PMHx of atypical chest pain (10/2023 CAC score of 415 with 99 percentile and moderate atherosclerosis, FFR with no significant stenosis), Palpitations (Zio 2019: NSR/sinus tach with rare PVCs), heart murmur, HDL, pulsatile tinnitus, OSA on CPAP, morbid obesity, idiopathic intracranial hypertension who reports to [facility] office for follow up.   Last seen in heartcare OV 09/25/2023 by Almarie Crate, NP for scheduled follow-up and recent ED visit the day prior for chest pain and high BP.  Reported 1 episode of chest pain that she related to stress and admitted to constant state of anxiety/stress.  ED workup was reassuring.  Reported recent BP readings of SBP in 190s, her BP in office today was normal.  Recommended proper BP monitoring techniques. Also noted being followed and managed for idiopathic intracranial hypertension.  Ordered CCTA and  BMP.  Continued on Lipitor 20 mg daily, HCTZ 12.5 mg daily, Lopressor  12.5 mg 4 times daily, and KCl 20 mEq twice daily.   CTA 10/02/2023 showed CAC score of 415 with 99 percentile with moderate atherosclerosis including 25 to 49% in ostial/proximal LAD as well as proximal OM1, 50 to 69% mid LAD, and mid to distal LCx and OM1 were not visualized.  FFR analysis showed no significant stenosis with left main 0.98, distal LAD 0.85, mid LCx 0.97, RCA 0.93, however distal LCx/RCA 1 were not visualized.  Recommended considering stress PET CT to assess for ischemia in the distal LCx/OM1 as well as for microvascular disease as cause of chest pain.  Recent ED visit 10/03/2023 for concerns of chest pressure, SOB and dizziness.  Also noted was very anxious and concern over results of coronary CT scan.  Results were  reviewed with patient.  Negative troponin. EKG without any ischemic.  CXR noted cardiomegaly and mild bronchial thickening.  Dimer elevated to 0.53 with follow-up CTA negative for PE.  Labs noted hypokalemia K 3 and discharged with KCl 20 mEq twice daily.  Today, reports ongoing chronic unchanged CP and shortness of breath associated with nausea, vomiting, and dizziness.  Chest pain described as constant heavy 3 out of 10, located midsternal, no radiation, worse with exertion and no alleviating factors.  SOB aches 1 to 2 weeks associated with minimal exertion and relieved with rest.  Also notes increased frequency of palpitations occurring daily for approximately 30 minutes described as pounding.  Denies any edema, orthopnea, and syncope.  Patient does not exercise and is not very active.  She notes 1 recent episode of elevated HR of 132 when walking in the grocery store last week.  She is very concerned about CCTA results.  During recent ED visit, she spoke with cardiologist who recommended considering catheterization with her results. Reports compliance with medications. Denies tobacco use/alcohol/drug use   ROS: 10 point review of system has been reviewed and considered negative except ones been listed in the HPI.   Studies Reviewed: SABRA   ECHO IMPRESSIONS 05/02/2023 Sonographer Comments: Global longitudinal strain was attempted.   1. Left ventricular ejection fraction, by estimation, is 60 to 65%. Left  ventricular ejection fraction by 3D volume is 62 %. The left ventricle has  normal function. The left ventricle has no regional wall motion  abnormalities.  Left ventricular diastolic   parameters were normal.   2. Right ventricular systolic function is normal. The right ventricular  size is normal. There is normal pulmonary artery systolic pressure. The  estimated right ventricular systolic pressure is 35.5 mmHg.   3. The mitral valve is degenerative. Mild to moderate mitral valve  regurgitation. No  evidence of mitral stenosis. The mean mitral valve  gradient is 3.0 mmHg.   4. The aortic valve is tricuspid. There is mild calcification of the  aortic valve. Aortic valve regurgitation is not visualized. Aortic valve  sclerosis/calcification is present, without any evidence of aortic  stenosis. Aortic valve mean gradient  measures 6.0 mmHg.   5. The inferior vena cava is normal in size with greater than 50%  respiratory variability, suggesting right atrial pressure of 3 mmHg.   Comparison(s): A prior study was performed on 08/01/2021. Prior images  reviewed side by side. LVEF normal range at 60-65%. Mild to moderate  mitral regurgitation.   Cardiac CT IMPRESSION 10/02/2023 1. Coronary calcium  score of 415. This was 99th percentile for age-, sex, and race-matched controls. 2. Total plaque volume 534 mm3 which is 93rd percentile for age- and sex-matched controls (calcified plaque 59mm3; non-calcified plaque 478mm3). TPV is severe. 3. Normal coronary origin with right dominance. 4. Moderate atherosclerosis: 25-49% ostial/prox LAD and prox OM1; 50-69% mid LAD. The mid to distal LCx and OM1 are not visualized. 5. This study has been submitted for FFR analysis of the LAD.   RECOMMENDATIONS: 1. CAD-RADS 0: No evidence of CAD (0%). Consider non-atherosclerotic causes of chest pain. 2. CAD-RADS 1: Minimal non-obstructive CAD (0-24%). Consider non-atherosclerotic causes of chest pain. Consider preventive therapy and risk factor modification. 3. CAD-RADS 2: Mild non-obstructive CAD (25-49%). Consider non-atherosclerotic causes of chest pain. Consider preventive therapy and risk factor modification. 4. CAD-RADS 3: Moderate stenosis. Consider symptom-guided anti-ischemic pharmacotherapy as well as risk factor modification per guideline directed care. Additional analysis with CT FFR will be submitted 5. CAD-RADS 4: Severe stenosis. (70-99% or > 50% left main). Cardiac catheterization or CT FFR  is recommended. Consider symptom-guided anti-ischemic pharmacotherapy as well as risk factor modification per guideline directed care. Invasive coronary angiography recommended with revascularization per published guideline statements. 6. CAD-RADS 5: Total coronary occlusion (100%). Consider cardiac catheterization or viability assessment. Consider symptom-guided anti-ischemic pharmacotherapy as well as risk factor modification per guideline directed care. 7. CAD-RADS N: Non-diagnostic study. Obstructive CAD can't be excluded. Alternative evaluation is recommended.  FFRCT analysis 10/03/2023 Normal FFR range is >0.80. 1. Left Main: No significant stenosis. FFR 0.98. 2. LAD: No significant stenosis. Distal FFR 0.85 3. LCx: No significant stenosis. Mid FFR 0.97. The distal LCx/OM1 were not modeled. 4. RCA: No significant stenosis. Distal FFR 0.93.   IMPRESSION: 1. Coronary CTA FFR analysis demonstrates no hemodynamically flow limiting lesions in vessels evaluated. The distal LCx/OM1 were not modeled. 2. Consider Stress Pet CT to assess for ischemia in the distal LCx/OM1 as well as for microvascular disease as a cause of chest pain.  Physical Exam:   VS:  There were no vitals taken for this visit.   Wt Readings from Last 3 Encounters:  10/03/23 286 lb 9.6 oz (130 kg)  09/25/23 288 lb 6.4 oz (130.8 kg)  06/15/23 291 lb 9.6 oz (132.3 kg)    GEN: Well nourished, well developed in no acute distress while sitting in chair. Anxious  NECK: No JVD; No carotid bruits CARDIAC: RRR, no murmurs, rubs, gallops RESPIRATORY:  Clear to auscultation  without rales, wheezing or rhonchi  ABDOMEN: Soft, non-tender, non-distended EXTREMITIES:  No edema; No deformity   ASSESSMENT AND PLAN: .   Chest pain of uncertain etiology History of chest pain Abnormal findings on diagnostic imaging of heart/coronary circulation 05/02/2023 showed 60 to 65%, mild to moderate MV regurgitation, AV  sclerosis/calcification w/o AS.  CTA 10/02/2023 showed CAC score of 415 with 99 percentile with moderate atherosclerosis including 25 to 49% in ostial/proximal LAD as well as proximal OM1, 50 to 69% mid LAD, and mid to distal LCx and OM1 were not visualized.   FFR analysis showed no significant stenosis with left main 0.98, distal LAD 0.85, mid LCx 0.97, RCA 0.93, however distal LCx/RCA 1 were not visualized.  Recommended considering stress PET CT to assess for ischemia in the distal LCx/OM1 as well as for microvascular disease as cause of chest pain. Reports chronic unchanged CP and SOB associated with N/V and dizziness. She is very concerned about CCTA results.  During recent ED visit, she spoke with cardiologist who recommended considering catheterization with her results.  Reviewed results as above.  Discussed stress PET as the next step option based on the report to evaluate microvascular disease and artery is not visualized.  Discussed that microvascular disease cannot be imaged on CT or cath.  Also shared webpage for more details on stress PET.  Patient is very adamant about proceeding with cath.  Discussed with results not showing any severe disease and recommending PET scan it is less likely that insurance will cover cath. Informed patient that this would have to be discussed with Dr. Alvan before moving forward.  Patient would prefer to proceed with cath if Dr. Alvan agrees, however if he does not agree then is open to stress PET. Per Dr. Alvan review of ER note and results, 3 different cardiologists including on-call cardiologist and ER, Dr. Shlomo who read the CTA and Dr. Alvan recommended PET scanning for outpatient microvascular disease.  CT results showed the large arteries without any significant blockage.  PET scan would show large arteries better as well as small arteries that do not show up on CT or cath.  Cath would not offer any additional information with the risk outweighing benefit.   Consulted with pharmacist team about antianginal medication in the setting of idiopathic intracranial hypertension. Per Allean Mink, recommended Ranexa  even though not great for acute CP but might decrease baseline CP.  Called back patient to discuss recommendations from Dr. Armida and pharmacist as above.  Reviewed the risk of cath versus stress PET.  Patient is aware and understands.  Patient agrees to proceed with cardiac PET as recommended by Dr. Alvan.  Order cardiac PET.  Patient also agrees to Ranexa .  Order Ranexa  500 mg twice daily Suspect anxiety is a contributing factor, however with known CAD as above additional ischemic evaluation is indicated. Strongly recommended to follow up with PCP for consideration of daily anxiety medication. She is scheduled for follow up with PCP next week.   Palpitations Zio 2019: NSR/sinus tach with rare PVCs Reports increased frequency of palpitations occurring daily for approximately 30 minutes described as pounding.   Order ZIO X 2 weeks Continue on Lopressor  12.5 mg 4 times daily  Hyperlipidemia LDL goal <55 Prior to CCTA results, patient LDL goal was < 100.  Currently with known CAD as above, patient is new LDL goal <70.  Discussed with patient. LDL 93 in 10/2022. Order FLP, Lpa Increase Lipitor from 20 mg to 40 mg daily.  Will plan for repeat FLP and LFTs for 12/05/2023 Managed by her endocrinologist.  Encouraged to continue to follow-up.  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 Discussed with Lipid clinic. They agreed to proceed with Repatha. Recommended LDL goal of < 55 due to being very high risk for ASCVD with extensive CAD on CT scan, high non-calcified plaque volume, young age and elevated LP(a). Order referral to PharmD clinic for Repatha. Patient was also referred to Lipid Clinic as well.   Hypokalemia  ED 10/03/2023: K 3 and discharged with KCl 20 mEq twice daily Order BMP to reassess K.   OSA on CPAP Reports  daily compliance with CPAP. Encouraged continued compliance  Morbid obesity (HCC) Encouraged weight loss via diet and exercise. Discussed the impact of being overweight in relation to cardiovascular risk.  Pulsatile tinnitus Noted since 2023. Carotid ultrasound benign 05/2021 and negative eval by ENT History of intermittent fluid in ears. Continue on hydrochlorothiazide  12.5 mg daily    Informed Consent   Shared Decision Making/Informed Consent{  The risks [chest pain, shortness of breath, cardiac arrhythmias, dizziness, blood pressure fluctuations, myocardial infarction, stroke/transient ischemic attack, nausea, vomiting, allergic reaction, radiation exposure, metallic taste sensation and life-threatening complications (estimated to be 1 in 10,000)], benefits (risk stratification, diagnosing coronary artery disease, treatment guidance) and alternatives of a cardiac PET stress test were discussed in detail with Ms. Koury and she agrees to proceed.     Dispo:  Follow up in 6-8 weeks with Dr. Alvan.   Signed, Lorette CINDERELLA Kapur, PA-C

## 2023-10-05 ENCOUNTER — Other Ambulatory Visit: Payer: Self-pay | Admitting: Physician Assistant

## 2023-10-05 ENCOUNTER — Encounter: Payer: Self-pay | Admitting: Nurse Practitioner

## 2023-10-05 ENCOUNTER — Other Ambulatory Visit: Payer: Self-pay

## 2023-10-05 ENCOUNTER — Emergency Department (HOSPITAL_COMMUNITY)
Admission: EM | Admit: 2023-10-05 | Discharge: 2023-10-05 | Disposition: A | Discharge status: Home / Self Care | Attending: Emergency Medicine | Admitting: Emergency Medicine

## 2023-10-05 ENCOUNTER — Ambulatory Visit: Payer: Self-pay | Admitting: Nurse Practitioner

## 2023-10-05 ENCOUNTER — Ambulatory Visit: Attending: Nurse Practitioner | Admitting: Physician Assistant

## 2023-10-05 ENCOUNTER — Encounter: Payer: Self-pay | Admitting: Physician Assistant

## 2023-10-05 ENCOUNTER — Telehealth: Payer: Self-pay | Admitting: Physician Assistant

## 2023-10-05 ENCOUNTER — Emergency Department (HOSPITAL_COMMUNITY)

## 2023-10-05 ENCOUNTER — Ambulatory Visit: Attending: Physician Assistant

## 2023-10-05 ENCOUNTER — Encounter (HOSPITAL_COMMUNITY): Payer: Self-pay

## 2023-10-05 VITALS — BP 118/82 | HR 79 | Ht 62.0 in | Wt 282.0 lb

## 2023-10-05 DIAGNOSIS — Z7982 Long term (current) use of aspirin: Secondary | ICD-10-CM | POA: Insufficient documentation

## 2023-10-05 DIAGNOSIS — I10 Essential (primary) hypertension: Secondary | ICD-10-CM | POA: Diagnosis not present

## 2023-10-05 DIAGNOSIS — R931 Abnormal findings on diagnostic imaging of heart and coronary circulation: Secondary | ICD-10-CM | POA: Diagnosis not present

## 2023-10-05 DIAGNOSIS — R002 Palpitations: Secondary | ICD-10-CM

## 2023-10-05 DIAGNOSIS — I2510 Atherosclerotic heart disease of native coronary artery without angina pectoris: Secondary | ICD-10-CM

## 2023-10-05 DIAGNOSIS — Z87898 Personal history of other specified conditions: Secondary | ICD-10-CM

## 2023-10-05 DIAGNOSIS — R0789 Other chest pain: Secondary | ICD-10-CM | POA: Diagnosis present

## 2023-10-05 DIAGNOSIS — G4733 Obstructive sleep apnea (adult) (pediatric): Secondary | ICD-10-CM

## 2023-10-05 DIAGNOSIS — E785 Hyperlipidemia, unspecified: Secondary | ICD-10-CM

## 2023-10-05 DIAGNOSIS — R079 Chest pain, unspecified: Secondary | ICD-10-CM

## 2023-10-05 DIAGNOSIS — Z79899 Other long term (current) drug therapy: Secondary | ICD-10-CM | POA: Insufficient documentation

## 2023-10-05 DIAGNOSIS — D72829 Elevated white blood cell count, unspecified: Secondary | ICD-10-CM | POA: Insufficient documentation

## 2023-10-05 DIAGNOSIS — E876 Hypokalemia: Secondary | ICD-10-CM

## 2023-10-05 LAB — CBC
HCT: 43.6 % (ref 36.0–46.0)
Hemoglobin: 14.6 g/dL (ref 12.0–15.0)
MCH: 30.2 pg (ref 26.0–34.0)
MCHC: 33.5 g/dL (ref 30.0–36.0)
MCV: 90.3 fL (ref 80.0–100.0)
Platelets: 307 K/uL (ref 150–400)
RBC: 4.83 MIL/uL (ref 3.87–5.11)
RDW: 13.4 % (ref 11.5–15.5)
WBC: 11 K/uL — ABNORMAL HIGH (ref 4.0–10.5)
nRBC: 0 % (ref 0.0–0.2)

## 2023-10-05 LAB — BASIC METABOLIC PANEL WITH GFR
Anion gap: 15 (ref 5–15)
BUN: 12 mg/dL (ref 6–20)
CO2: 24 mmol/L (ref 22–32)
Calcium: 9 mg/dL (ref 8.9–10.3)
Chloride: 101 mmol/L (ref 98–111)
Creatinine, Ser: 0.89 mg/dL (ref 0.44–1.00)
GFR, Estimated: >60 mL/min (ref >60)
Glucose, Bld: 115 mg/dL — ABNORMAL HIGH (ref 70–99)
Potassium: 3.9 mmol/L (ref 3.5–5.1)
Sodium: 140 mmol/L (ref 135–145)

## 2023-10-05 LAB — TROPONIN I (HIGH SENSITIVITY): Troponin I (High Sensitivity): 2 ng/L (ref <18)

## 2023-10-05 MED ORDER — ATORVASTATIN CALCIUM 40 MG PO TABS
40.0000 mg | ORAL_TABLET | Freq: Every day | ORAL | 1 refills | Status: AC
Start: 2023-10-05 — End: ?

## 2023-10-05 MED ORDER — RANOLAZINE ER 500 MG PO TB12: 500.0000 mg | ORAL_TABLET | Freq: Two times a day (BID) | ORAL | 0 refills | Status: DC

## 2023-10-05 NOTE — Telephone Encounter (Signed)
 Checking percert on the following patient for   LONG TERM MONITOR [[id number]]

## 2023-10-05 NOTE — ED Notes (Signed)
 ED Provider at bedside.

## 2023-10-05 NOTE — Discharge Instructions (Signed)
 Follow-up with cardiology this morning as scheduled.

## 2023-10-05 NOTE — ED Provider Notes (Signed)
 [place] EM[facility]GENCY DEPARTMENT AT [facility] Provider Note   CSN: [id number] Arrival date & time: 10/05/23  0154     Patient presents with: Chest Pain   Rachel Holland is a 53 y.o. female.   Patient is a 53 year old female with history of hyperlipidemia, pulmonary hypertension, sleep apnea, GERD, anxiety, hypertension.  Patient presenting today with complaints of chest discomfort.  She describes a pressure across the upper chest that has been present intermittently for the past 2 weeks.  She has been seen here on 2 prior occasions and has an appointment with her physician this morning.  She presents tonight with ongoing discomfort.  She describes shortness of breath and feeling nauseated.  She recently had a coronary CT performed showing a high calcium  score with an LAD lesion of 50 to 69% and she is greatly concerned about this.  Patient seen here 2 days ago with negative workup.  There was discussion with cardiology who felt patient was appropriate for outpatient follow-up.       Prior to Admission medications   Medication Sig Start Date End Date Taking? Authorizing Provider  acetaminophen  (TYLENOL ) 500 MG tablet Take 1,000 mg by mouth every 6 (six) hours as needed for mild pain (pain score 1-3).    [provider]  Ascorbic Acid (VITAMIN C PO) Take 1 tablet by mouth daily.    [provider]  aspirin EC 81 MG tablet Take 4 tablets by mouth every 6 (six) hours as needed (chest pain). Swallow whole.    [provider]  atorvastatin  (LIPITOR) 20 MG tablet Take 1 tablet by mouth once daily 08/17/23   Alvan Dorn FALCON, MD  clonazePAM (KLONOPIN) 0.5 MG tablet Take 0.5 mg by mouth daily as needed for anxiety.     [provider]  hydrochlorothiazide  (HYDRODIURIL ) 12.5 MG tablet Take 1 tablet (12.5 mg total) by mouth daily. 07/21/22   Miriam Norris, NP  metoprolol  tartrate (LOPRESSOR ) 25 MG tablet Take 0.5 tablets (12.5 mg total) by mouth in  the morning, at noon, in the evening, and at bedtime. Take an extra 12.5 Mg tablet nightly 04/10/23   Miriam Norris, NP  pantoprazole  (PROTONIX ) 40 MG tablet Take 40 mg by mouth daily. 05/04/21   [provider]  potassium chloride  SA (KLOR-CON  M) 20 MEQ tablet Take 20 mEq by mouth 2 (two) times daily.    [provider]  potassium chloride  SA (KLOR-CON  M) 20 MEQ tablet Take 1 tablet (20 mEq total) by mouth 2 (two) times daily for 10 days. 10/03/23 10/13/23  Cottie Donnice PARAS, MD    Allergies: Sulfa antibiotics and Doxycycline    Review of Systems  All other systems reviewed and are negative.   Updated Vital Signs BP (!) 170/76 (BP Location: Right Arm)   Pulse 81   Temp 98 F (36.7 C) (Oral)   Resp 16   Ht 5' 2 (1.575 m)   Wt 130 kg   SpO2 97%   BMI 52.42 kg/m   Physical Exam Vitals and nursing note reviewed.  Constitutional:      General: She is not in acute distress.    Appearance: She is well-developed. She is not diaphoretic.  HENT:     Head: Normocephalic and atraumatic.  Cardiovascular:     Rate and Rhythm: Normal rate and regular rhythm.     Heart sounds: No murmur heard.    No friction rub. No gallop.  Pulmonary:     Effort: Pulmonary effort  is normal. No respiratory distress.     Breath sounds: Normal breath sounds. No wheezing.  Abdominal:     General: Bowel sounds are normal. There is no distension.     Palpations: Abdomen is soft.     Tenderness: There is no abdominal tenderness.  Musculoskeletal:        General: Normal range of motion.     Cervical back: Normal range of motion and neck supple.     Right lower leg: No edema.     Left lower leg: No edema.  Skin:    General: Skin is warm and dry.  Neurological:     General: No focal deficit present.     Mental Status: She is alert and oriented to person, place, and time.     (all labs ordered are listed, but only abnormal results are displayed) Labs Reviewed  BASIC METABOLIC PANEL WITH  GFR  CBC  TROPONIN I (HIGH SENSITIVITY)    EKG: EKG Interpretation Date/Time:  Friday October 05 2023 02:14:12 EDT Ventricular Rate:  76 PR Interval:  134 QRS Duration:  78 QT Interval:  406 QTC Calculation: 457 R Axis:   73  Text Interpretation: Sinus rhythm Low voltage, precordial leads Borderline T abnormalities, anterior leads No significant change since 10/03/2023 Confirmed by Geroldine Berg ([id number]) on 10/05/2023 2:17:14 AM  Radiology: CT Angio Chest PE W and/or Wo Contrast Result Date: 10/03/2023 CLINICAL DATA:  Concern for pulmonary embolism. EXAM: CT ANGIOGRAPHY CHEST WITH CONTRAST TECHNIQUE: Multidetector CT imaging of the chest was performed using the standard protocol during bolus administration of intravenous contrast. Multiplanar CT image reconstructions and MIPs were obtained to evaluate the vascular anatomy. RADIATION DOSE REDUCTION: This exam was performed according to the departmental dose-optimization program which includes automated exposure control, adjustment of the mA and/or kV according to patient size and/or use of iterative reconstruction technique. CONTRAST:  75mL OMNIPAQUE  IOHEXOL  350 MG/ML SOLN COMPARISON:  Chest radiograph dated 10/03/2023. FINDINGS: Cardiovascular: There is no cardiomegaly or pericardial effusion. The thoracic aorta is unremarkable. The origins of the great vessels of the aortic arch appear patent. Evaluation of the pulmonary arteries is limited due to respiratory motion. No pulmonary artery embolus identified. Mediastinum/Nodes: No hilar or mediastinal adenopathy. Small hiatal hernia. The esophagus is grossly unremarkable. No mediastinal fluid collection. Lungs/Pleura: No focal consolidation, pleural effusion or pneumothorax. The central airways are patent. Upper Abdomen: Fatty appearing liver. 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 Musculoskeletal: No acute osseous pathology. Review of the MIP  images confirms the above findings. IMPRESSION: 1. No acute intrathoracic pathology. No CT evidence of pulmonary artery embolus. 2. Fatty liver. Electronically Signed   By: Vanetta Chou M.D.   On: 10/03/2023 20:26   DG Chest Port 1 View Result Date: 10/03/2023 CLINICAL DATA:  Dizziness and chest pain. EXAM: PORTABLE CHEST 1 VIEW COMPARISON:  Cardiac CT yesterday FINDINGS: The heart is enlarged. Mediastinal contours are normal. Mild bronchial thickening. No focal airspace disease, pleural fluid or pneumothorax. No acute osseous abnormalities are seen. IMPRESSION: Cardiomegaly. Mild bronchial thickening. Electronically Signed   By: Andrea Gasman M.D.   On: 10/03/2023 18:09   CT CORONARY FRACTIONAL FLOW RESERVE FLUID ANALYSIS Result Date: 10/03/2023 EXAM: FFRCT ANALYSIS FINDINGS: FFRct analysis was performed on the original cardiac CT angiogram dataset. Diagrammatic representation of the FFRct analysis is provided in a separate PDF document in PACS. This dictation was created using the PDF document and an interactive 3D model  of the results. 3D model is not available in the EMR/PACS. Normal FFR range is >0.80. 1. Left Main: No significant stenosis. FFR 0.98. 2. LAD: No significant stenosis. Distal FFR 0.85 3. LCx: No significant stenosis. Mid FFR 0.97. The distal LCx/OM1 were not modeled. 4. RCA: No significant stenosis. Distal FFR 0.93. IMPRESSION: 1. Coronary CTA FFR analysis demonstrates no hemodynamically flow limiting lesions in vessels evaluated. The distal LCx/OM1 were not modeled. 2. Consider Stress Pet CT to assess for ischemia in the distal LCx/OM1 as well as for microvascular disease as a cause of chest pain. Wilbert Bihari, MD Electronically Signed   By: Wilbert Bihari M.D.   On: 10/03/2023 12:14     Procedures   Medications Ordered in the ED - No data to display                                  Medical Decision Making Amount and/or Complexity of Data Reviewed Labs: ordered. Radiology:  ordered.   Patient is a 53 year old female presenting with chest discomfort as described in the HPI.  Patient arrives here with stable vital signs and is afebrile.  Physical examination basically unremarkable.  Laboratory studies obtained including CBC, metabolic panel, and troponin, all of which are unremarkable.  Chest x-ray showing no acute process.  Cardiac workup is unremarkable.  Troponin remains flat and EKG is unchanged.  I highly doubt a cardiac etiology to her symptoms, but she does have some underlying disease based on her coronary CT. She has an appointment this morning with cardiology and I have advised her to keep this appointment to discuss the next appropriate course of action.     Final diagnoses:  None    ED Discharge Orders     None          Geroldine Berg, MD 10/05/23 [phone]

## 2023-10-05 NOTE — ED Triage Notes (Signed)
 Pov form home. Cc of SOB/CP that has been unresolved since her last visit.  5/10 Due to see pcp at 930 today for follow up on ED visit and to go over CTA

## 2023-10-05 NOTE — Patient Instructions (Addendum)
 Medication Instructions:  Your physician has recommended you make the following change in your medication:  Please Increase Atorvastatin  to 40 Mg daily  Please start Ranexa  500 Mg Twice daily   Labwork: In 2-3 weeks at [facility]   Testing/Procedures: ZIO- Long Term Monitor Instructions   Your physician has requested you wear your ZIO patch monitor 14 days.   This is a single patch monitor.  [employer] supplies one patch monitor per enrollment.  Additional stickers are not available.   Please do not apply patch if you will be having a Nuclear Stress Test, Echocardiogram, Cardiac CT, MRI, or Chest Xray during the time frame you would be wearing the monitor. The patch cannot be worn during these tests.  You cannot remove and re-apply the ZIO XT patch monitor.   Your ZIO patch monitor will be sent USPS Priority mail from [facility] directly to your home address. The monitor may also be mailed to a PO BOX if home delivery is not available.   It may take 3-5 days to receive your monitor after you have been enrolled.   Once you have received you monitor, please review enclosed instructions.  Your monitor has already been registered assigning a specific monitor serial # to you.   Applying the monitor   Shave hair from upper left chest.   Hold abrader disc by orange tab.  Rub abrader in 40 strokes over left upper chest as indicated in your monitor instructions.   Clean area with 4 enclosed alcohol pads .  Use all pads to assure are is cleaned thoroughly.  Let dry.   Apply patch as indicated in monitor instructions.  Patch will be place under collarbone on left side of chest with arrow pointing upward.   Rub patch adhesive wings for 2 minutes.Remove white label marked 1.  Remove white label marked 2.  Rub patch adhesive wings for 2 additional minutes.   While looking in a mirror, press and release button in center of patch.  A small green light will flash 3-4 times .  This will be  your only indicator the monitor has been turned on.     Do not shower for the first 24 hours.  You may shower after the first 24 hours.   Press button if you feel a symptom. You will hear a small click.  Record Date, Time and Symptom in the Patient Log Book.   When you are ready to remove patch, follow instructions on last 2 pages of Patient Log Book.  Stick patch monitor onto last page of Patient Log Book.   Place Patient Log Book in [place] box.  Use locking tab on box and tape box closed securely.  The [employer] has [employer] on it.  Please place in mailbox as soon as possible.  Your physician should have your test results approximately 7 days after the monitor has been mailed back to [facility].   Call [facility] Customer Care at [phone] if you have questions regarding your ZIO XT patch monitor.  Call them immediately if you see an orange light blinking on your monitor.   If your monitor falls off in less than 4 days contact our Monitor department at [phone].  If your monitor becomes loose or falls off after 4 days call [employer] at [phone] for suggestions on securing your monitor.  Follow-Up: Your physician recommends that you schedule a follow-up appointment in: 6-8 weeks   Any Other Special Instructions Will Be Listed  Below (If Applicable).  If you need a refill on your cardiac medications before your next appointment, please call your pharmacy.

## 2023-10-08 ENCOUNTER — Telehealth (HOSPITAL_COMMUNITY): Payer: Self-pay | Admitting: *Deleted

## 2023-10-08 ENCOUNTER — Other Ambulatory Visit: Payer: Self-pay | Admitting: Physician Assistant

## 2023-10-08 ENCOUNTER — Encounter: Payer: Self-pay | Admitting: Physician Assistant

## 2023-10-08 NOTE — Telephone Encounter (Signed)
Reaching out to patient to offer assistance regarding upcoming cardiac imaging study; pt verbalizes understanding of appt date/time, parking situation and where to check in, pre-test NPO status and verified current allergies; name and call back number provided for further questions should they arise  Larey Brick RN Navigator Cardiac Imaging [facility] [phone] office [phone] cell  Patient aware to avoid caffeine 12 hours prior to her cardiac PET study.

## 2023-10-09 ENCOUNTER — Ambulatory Visit (HOSPITAL_COMMUNITY)
Admission: RE | Admit: 2023-10-09 | Discharge: 2023-10-09 | Disposition: A | Discharge status: Home / Self Care | Source: Ambulatory Visit | Attending: Physician Assistant | Admitting: Physician Assistant

## 2023-10-09 ENCOUNTER — Ambulatory Visit: Payer: Self-pay | Admitting: Physician Assistant

## 2023-10-09 DIAGNOSIS — I2510 Atherosclerotic heart disease of native coronary artery without angina pectoris: Secondary | ICD-10-CM

## 2023-10-09 DIAGNOSIS — E785 Hyperlipidemia, unspecified: Secondary | ICD-10-CM

## 2023-10-09 DIAGNOSIS — E7841 Elevated Lipoprotein(a): Secondary | ICD-10-CM

## 2023-10-09 LAB — NM PET CT CARDIAC PERFUSION MULTI W/ABSOLUTE BLOODFLOW
LV dias vol: 69 mL (ref 46–106)
LV sys vol: 16 mL (ref 3.8–5.2)
MBFR: 3.16
Nuc Rest EF: 77 %
Nuc Stress EF: 76 %
Peak HR: 123 {beats}/min
Rest HR: 81 {beats}/min
Rest MBF: 0.99 ml/g/min
Rest Nuclear Isotope Dose: 30 mCi
ST Depression (mm): 0 mm
Stress MBF: 3.13 ml/g/min
Stress Nuclear Isotope Dose: 30 mCi

## 2023-10-09 MED ORDER — RUBIDIUM RB82 GENERATOR (RUBYFILL)
29.9800 | PACK | Freq: Once | INTRAVENOUS | Status: AC
Start: 2023-10-09 — End: 2023-10-09
  Administered 2023-10-09: 29.98 via INTRAVENOUS

## 2023-10-09 MED ORDER — REGADENOSON 0.4 MG/5ML IV SOLN
INTRAVENOUS | Status: AC
Start: 2023-10-09 — End: 2023-10-09
  Filled 2023-10-09: qty 5

## 2023-10-09 MED ORDER — RUBIDIUM RB82 GENERATOR (RUBYFILL)
30.0200 | PACK | Freq: Once | INTRAVENOUS | Status: AC
Start: 2023-10-09 — End: 2023-10-09
  Administered 2023-10-09: 30.02 via INTRAVENOUS

## 2023-10-09 MED ORDER — REGADENOSON 0.4 MG/5ML IV SOLN
0.4000 mg | Freq: Once | INTRAVENOUS | Status: AC
Start: 2023-10-09 — End: 2023-10-09
  Administered 2023-10-09: 0.4 mg via INTRAVENOUS

## 2023-10-09 NOTE — Progress Notes (Signed)
 Pt. Tolerated lexi scan well.

## 2023-10-10 ENCOUNTER — Encounter: Payer: Self-pay | Admitting: Cardiology

## 2023-10-10 ENCOUNTER — Ambulatory Visit: Attending: Cardiology | Admitting: Cardiology

## 2023-10-10 VITALS — BP 112/70 | HR 68 | Ht 62.0 in | Wt 282.0 lb

## 2023-10-10 DIAGNOSIS — R0602 Shortness of breath: Secondary | ICD-10-CM

## 2023-10-10 DIAGNOSIS — R0789 Other chest pain: Secondary | ICD-10-CM

## 2023-10-10 DIAGNOSIS — R002 Palpitations: Secondary | ICD-10-CM | POA: Diagnosis not present
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
## 2023-10-10 NOTE — Patient Instructions (Addendum)
 Medication Instructions:  Your physician has recommended you make the following change in your medication:  Start taking Aspirin 81 mg once daily-Patient will pick up over the counter Stop taking Ranexa  Continue taking all other medications as prescribed  Labwork: None  Testing/Procedures: None  Follow-Up: Your physician recommends that you schedule a follow-up appointment in: Keep Follow up as scheduled  Any Other Special Instructions Will Be Listed Below (If Applicable). Thank you for choosing [facility]!     If you need a refill on your cardiac medications before your next appointment, please call your pharmacy.

## 2023-10-22 ENCOUNTER — Other Ambulatory Visit (HOSPITAL_BASED_OUTPATIENT_CLINIC_OR_DEPARTMENT_OTHER): Payer: Self-pay | Admitting: Nurse Practitioner

## 2023-10-23 LAB — LIPID PANEL
Chol/HDL Ratio: 2.7 ratio (ref 0.0–4.4)
Cholesterol, Total: 150 mg/dL (ref 100–199)
HDL: 55 mg/dL (ref >39)
LDL Chol Calc (NIH): 68 mg/dL (ref 0–99)
Triglycerides: 161 mg/dL — ABNORMAL HIGH (ref 0–149)
VLDL Cholesterol Cal: 27 mg/dL (ref 5–40)

## 2023-10-23 LAB — LIPOPROTEIN A (LPA): Lipoprotein (a): 156.4 nmol/L — ABNORMAL HIGH (ref <75.0)

## 2023-10-24 ENCOUNTER — Other Ambulatory Visit (HOSPITAL_COMMUNITY): Payer: Self-pay

## 2023-10-24 ENCOUNTER — Encounter: Payer: Self-pay | Admitting: Cardiology

## 2023-10-24 DIAGNOSIS — R0602 Shortness of breath: Secondary | ICD-10-CM

## 2023-10-24 LAB — BRAIN NATRIURETIC PEPTIDE: BNP: 32.5 pg/mL (ref 0.0–100.0)

## 2023-10-25 ENCOUNTER — Other Ambulatory Visit: Payer: Self-pay | Admitting: *Deleted

## 2023-10-25 DIAGNOSIS — E785 Hyperlipidemia, unspecified: Secondary | ICD-10-CM

## 2023-10-25 DIAGNOSIS — E7841 Elevated Lipoprotein(a): Secondary | ICD-10-CM

## 2023-10-25 DIAGNOSIS — I2510 Atherosclerotic heart disease of native coronary artery without angina pectoris: Secondary | ICD-10-CM

## 2023-10-25 NOTE — Telephone Encounter (Signed)
-----   Message from Lorette CINDERELLA Kapur sent at 10/25/2023 12:35 PM EDT ----- Dr. Mona agrees with proceeding forward with Repatha. Please send referral to PharmD (Dx: Elevated lipoprotein A, CAD, HLD)  ----- Message ----- From: Interface, [facility] Lab Results In Sent: 10/23/2023   7:38 AM EDT To: Lorette CINDERELLA Kapur, PA-C

## 2023-10-25 NOTE — Telephone Encounter (Signed)
 Referral to lipid disorders placed.

## 2023-11-02 ENCOUNTER — Encounter (HOSPITAL_COMMUNITY)

## 2023-11-05 ENCOUNTER — Encounter: Payer: Self-pay | Admitting: Internal Medicine

## 2023-11-05 ENCOUNTER — Ambulatory Visit (INDEPENDENT_AMBULATORY_CARE_PROVIDER_SITE_OTHER): Admitting: Internal Medicine

## 2023-11-05 ENCOUNTER — Ambulatory Visit: Payer: Self-pay | Admitting: Nurse Practitioner

## 2023-11-05 VITALS — BP 143/85 | HR 80 | Temp 98.1°F | Ht 62.0 in | Wt 282.2 lb

## 2023-11-05 DIAGNOSIS — Z6841 Body Mass Index (BMI) 40.0 and over, adult: Secondary | ICD-10-CM | POA: Diagnosis not present

## 2023-11-05 DIAGNOSIS — K219 Gastro-esophageal reflux disease without esophagitis: Secondary | ICD-10-CM

## 2023-11-05 DIAGNOSIS — K5909 Other constipation: Secondary | ICD-10-CM | POA: Diagnosis not present

## 2023-11-05 DIAGNOSIS — R1110 Vomiting, unspecified: Secondary | ICD-10-CM

## 2023-11-05 DIAGNOSIS — K760 Fatty (change of) liver, not elsewhere classified: Secondary | ICD-10-CM | POA: Diagnosis not present

## 2023-11-05 MED ORDER — PANTOPRAZOLE SODIUM 40 MG PO TBEC: 40.0000 mg | DELAYED_RELEASE_TABLET | Freq: Two times a day (BID) | ORAL | 3 refills | Status: AC
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
## 2023-11-05 NOTE — Patient Instructions (Addendum)
 Good to see you again today.  You likely are having breakthrough regurgitation and reflux symptoms in part due to significant obesity.  Once daily pantoprazole  is likely not enough.  It is great to see you starting to lose some weight.  Continue this trend.  No alarm symptoms at this time so no endoscopy is needed  For now, will increase Protonix  to 40 mg twice daily best taken before breakfast and supper (dispense 60 with 3 refills  Plan for screening colonoscopy 2029  For bowel function do not hesitate to take MiraLAX 17 g or a capful of powder in 8 ounces of water  every night to maintain good bowel function.  As far as weight management is concerned and yes I agree that a GLP-1 agonist like Ozempic or Wegovy would likely be of overall benefit.  This would be ordered through your primary care physician  If you could get some regular exercise that would also be helpful as far as fatty liver goes.  1 to 2 cups of black coffee daily has found to be protective in managing fatty liver.  As discussed, we will consider getting a noninvasive scan to look at liver's stiffness later in the year.  Plan to see you back in 2 months.

## 2023-11-06 ENCOUNTER — Other Ambulatory Visit (HOSPITAL_COMMUNITY)

## 2023-11-07 ENCOUNTER — Ambulatory Visit: Payer: Self-pay | Admitting: Physician Assistant

## 2023-11-07 DIAGNOSIS — R002 Palpitations: Secondary | ICD-10-CM

## 2023-11-14 NOTE — Progress Notes (Signed)
 Patient ID: Rachel Holland                 DOB: May 14, 1970                    MRN: [id number]      HPI: Rachel Holland is a 53 y.o. female patient referred to lipid clinic by Rachel Holland. PMH is significant for hypertension, pulmonary hypertension, OSA, Obesity, prediabetes, HLD CAD, elevated Lpa.  The patient presented today for a lipid clinic. She reports tolerating the increased dose of atorvastatin  well, with no side effects. She has been making gradual, meaningful changes to her diet, following a heart-healthy approach, and has lost approximately 15 pounds over the past six months. Additionally, she has recently incorporated chair exercises into her routine, performing them for 15 minutes daily, and has increased her overall physical activity around the house. We reviewed therapeutic options for further lowering LDL cholesterol, including ezetimibe, PCSK-9 inhibitors, bempedoic acid, and inclisiran. The discussion included mechanisms of action, dosing schedules, potential side effects, and expected reductions in LDL levels. We also addressed cost considerations and explored potential patient assistance programs to support access to these therapies.  Current Medications: atorvastatin  40 mg daily  Intolerances: none  Risk Factors: hypertension, obesity, prediabetes, premature moderate CAD, family history of ASCVD. LDL goal: <55  Last lab: 09/25 Lpa 156.4, TC 150, TG 161, LDL 68 HDLc 55- patient states she did not fast    Diet: lost 25 lbs in 6 months  Avoid salty and sweet food B- eggs, whole wheat toast with peanut butter L- salads with home made vinaigrette grilled chicken or tuna  D- vegetables, meat - chicken grilled or baked  Snack: banana, nuts , apple, pumpkin cookie  Drink: water , coffee- 1 cup    Exercise: chair exercise 15 min per day - walks 15 min post meals   Family History:  Relation Problem Comments  Mother (Alive) Diverticulitis     Father Metallurgist) Parkinson's  disease and some ectopic beats  Prostate cancer stroke in his 65's       Social History:  Alcohol: none  Smoking: none  Labs:  Lipid Panel     Component Value Date/Time   CHOL 150 10/22/2023 0957   TRIG 161 (H) 10/22/2023 0957   HDL 55 10/22/2023 0957   CHOLHDL 2.7 10/22/2023 0957   LDLCALC 68 10/22/2023 0957   LABVLDL 27 10/22/2023 0957    Past Medical History:  Diagnosis Date   Anxiety    COVID-19 virus infection 12/2019   Depression    Dizziness    GERD (gastroesophageal reflux disease)    Hypertension    Morbid obesity (HCC)    Palpitations    PONV (postoperative nausea and vomiting)    PVCs (premature ventricular contractions)    Sleep apnea     Current Outpatient Medications on File Prior to Visit  Medication Sig Dispense Refill   aspirin EC 81 MG tablet Take 81 mg by mouth daily. Swallow whole.     atorvastatin  (LIPITOR) 40 MG tablet Take 1 tablet (40 mg total) by mouth daily. 90 tablet 1   clonazePAM (KLONOPIN) 0.5 MG tablet Take 0.5 mg by mouth daily as needed for anxiety.      hydrochlorothiazide  (HYDRODIURIL ) 12.5 MG tablet Take 1 tablet (12.5 mg total) by mouth daily. 90 tablet 1   metoprolol  tartrate (LOPRESSOR ) 25 MG tablet Take 0.5 tablets (12.5 mg total) by mouth in the morning, at noon, in the  evening, and at bedtime. Take an extra 12.5 Mg tablet nightly 270 tablet 1   pantoprazole  (PROTONIX ) 40 MG tablet Take 1 tablet (40 mg total) by mouth 2 (two) times daily before a meal. 60 tablet 3   potassium chloride  SA (KLOR-CON  M) 20 MEQ tablet Take 20 mEq by mouth 2 (two) times daily.     potassium chloride  SA (KLOR-CON  M) 20 MEQ tablet Take 1 tablet (20 mEq total) by mouth 2 (two) times daily for 10 days. 20 tablet 0   No current facility-administered medications on file prior to visit.    Allergies  Allergen Reactions   Sulfa Antibiotics Nausea And Vomiting   Doxycycline Nausea Only    Assessment/Plan:  1. Hyperlipidemia -  Problem  Mixed  Hyperlipidemia   Current Medications: atorvastatin  40 mg daily  Intolerances: none  Risk Factors: hypertension, obesity, prediabetes, premature moderate CAD, family history of ASCVD. LDL goal: <55  Last lab: 09/25 Lpa 156.4, TC 150, TG 161, LDL 68 HDLc 55- patient states she did not fast     Mixed hyperlipidemia Assessment:  LDL goal: <55 mg/dl last LDLc 68 mg/dl (90/7974) Tolerates high intensity statins well without any side effects  Discussed next potential options (Zetia, PCSK-9 inhibitors, bempedoic acid and inclisiran); cost, dosing efficacy, side effects  Follows heart healthy diet and started doing some exercise   Lpa is elevated but there is no current FDA approved treatment to lower Lpa, PCSk9i response on Lpa varies patient to patient - after trial and failure of two first line agent may consider PCSK9i agent   Plan: Continue taking current medications (atorvastatin  40 mg daily) Start taking Zetia 10 mg daily will repeat lab in 3 months      Thank you,  Rachel Holland, Pharm.D [place] Rachel Holland. [facility] [address], [place], [place] [address] Phone: [phone]; Fax: [phone]

## 2023-11-15 ENCOUNTER — Telehealth: Payer: Self-pay | Admitting: Cardiology

## 2023-11-15 ENCOUNTER — Ambulatory Visit: Attending: Cardiology | Admitting: Pharmacist

## 2023-11-15 ENCOUNTER — Encounter: Payer: Self-pay | Admitting: Pharmacist

## 2023-11-15 DIAGNOSIS — E782 Mixed hyperlipidemia: Secondary | ICD-10-CM | POA: Diagnosis not present

## 2023-11-15 MED ORDER — EZETIMIBE 10 MG PO TABS: 10.0000 mg | ORAL_TABLET | Freq: Every day | ORAL | 3 refills | Status: AC

## 2023-11-15 NOTE — Assessment & Plan Note (Addendum)
 Assessment:  LDL goal: <55 mg/dl last LDLc 68 mg/dl (90/7974) Tolerates high intensity statins well without any side effects  Discussed next potential options (Zetia, PCSK-9 inhibitors, bempedoic acid and inclisiran); cost, dosing efficacy, side effects  Follows heart healthy diet and started doing some exercise   Lpa is elevated but there is no current FDA approved treatment to lower Lpa, PCSk9i response on Lpa varies patient to patient - after trial and failure of two first line agent may consider PCSK9i agent   Plan: Continue taking current medications (atorvastatin  40 mg daily) Start taking Zetia 10 mg daily will repeat lab in 3 months

## 2023-11-15 NOTE — Telephone Encounter (Signed)
 Pt c/o of Chest Pain: STAT if active CP, including tightness, pressure, jaw pain, radiating pain to shoulder/upper arm/back, CP unrelieved by Nitro. Symptoms reported of SOB, nausea, vomiting, sweating.   1. Are you having CP right now? Pt was here to see pharm d and told her she is having chest pain on and off.      2. Are you experiencing any other symptoms (ex. SOB, nausea, vomiting, sweating)? Unsure Spoke with Pharm D     3. Is your CP continuous or coming and going? Coming and going      4. Have you taken Nitroglycerin ? Unsure Spoke with Pharm D      5. How long have you been experiencing CP? Unsure Spoke with Pharm D      6. If NO CP at time of call then end call with telling Pt to call back or call 911 if Chest pain returns prior to return call from triage team.    Pt I in lobby Pharm d said she would like an EKG

## 2023-11-15 NOTE — Telephone Encounter (Signed)
 Spoke with patient - rating her cp 4/10.  She is not prescribed NTG.  Discussed previous finding per Dr. 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 Plan for aggressive lipid lowering goal LDL <70, start ASA 81mg  daily. Aggressive risk factor modification - can d/c ranexa  - asked to f/u with pcp to consider noncardiac causes of her symptoms. Prior smoking history, BMI 52 could consider PFTs to evaluate for possible obstructive or restrictive lung disease.   PFT scheduled for tomorrow, 11/16/23 & follow up office visit with Almarie Crate, NP on 11/23/23.    Encouraged her to follow up with pcp as suggested by Dr. Alvan or she may go to ED if she feels her symptoms are worsening.

## 2023-11-15 NOTE — Patient Instructions (Addendum)
 Your Results:             Your most recent labs Goal  Total Cholesterol 150 < 200  Triglycerides 161 < 150  HDL (happy/good cholesterol) 55 > 40  LDL (lousy/bad cholesterol 68 < 55   Medication changes: continue taking  atorvastatin  40 mg daily  and start taking Zetia 10 mg daily   Lab orders: We want to repeat labs after 2-3 months.  We will send you a lab order to remind you once we get closer to that time.        Robbi Blanch, Pharm.D [place] [facility]. [facility] [address], [place], [place] [address] Phone: [phone]; Fax: [phone]

## 2023-11-16 ENCOUNTER — Inpatient Hospital Stay (HOSPITAL_COMMUNITY): Admission: RE | Admit: 2023-11-16 | Source: Ambulatory Visit

## 2023-11-22 ENCOUNTER — Ambulatory Visit: Admitting: Nurse Practitioner

## 2023-11-23 ENCOUNTER — Ambulatory Visit: Attending: Nurse Practitioner | Admitting: Nurse Practitioner

## 2023-11-23 NOTE — Progress Notes (Deleted)
 Office Visit    Patient Name: Rachel Holland Date of Encounter: 09/25/2023 PCP:  Maree Isles, MD [facility]  Cardiologist:  Alvan Carrier, MD  Advanced Practice Provider:  No care team member to display Electrophysiologist:  None   Chief Complaint and HPI    Rachel Holland is a 53 y.o. female with a hx of palpitations, heart murmur, HLD, pulsatile tinnitus, OSA on CPAP, and morbid obesity, who presents today for overdue follow-up.   Previous event monitor in 2019 showed sinus rhythm/sinus tach with rare PVCs, no significant arrhythmias.  Last seen by Dr. Carrier Alvan on December 07, 2021.  Her palpitations are overall stable and tolerable on Lopressor .  She had noted pulsating sensation in ears.  Carotid ultrasound benign, negative eval by ENT.  Echo revealed aortic sclerosis, mild MR.  History of intermittent fluid in ears, hence why she remain on HCTZ.  I last saw her for follow-up on July 21, 2022.  She continued to admit to stable pulsating sensation in her ears.  Noted difficulty losing weight.  Overall was doing well from a cardiac perspective.    ED visit to [facility] on February 02, 2023. She noted some dizziness where she described lightheadedness while laying down.  Another episode of dizziness while sitting up from a laying position.  Denied spinning sensation.  Also noted chest pain.  Workup was overall reassuring.   Today she presents for scheduled follow-up.  She wants to know she is due for an echocardiogram as she believes this is due to be updated.  She notes DOE as well as palpitations.  Notices it is at nighttime mainly, denies any tachycardia.  Describes palpitations sensation as hard.  Continues to note continuous/stable pulsating sensation in her ears. Denies any syncope, presyncope, dizziness, orthopnea, PND, swelling or significant weight changes, acute bleeding, or claudication.  ED visit yesterday at [facility] for  chest pain. Has also noticed high BP - see telephone note from earlier today.   09/25/2023 -tells me she had an episode of chest pain this past Saturday, 1 of is related to stress that she admits to constant state of anxiety/stress.  Her workup was reassuring.  Difficult for her to describe this episode but did admit to chest pain that also went behind her left arm.  This concerned her and prompted her to go to the ED.  She also tells me she is being followed and managed for idiopathic intracranial hypertension.  She has noticed her BP trends have been higher recently.  Admits to recent BP reading of SBP's in 190s.  Denies any recurrent chest pain. Denies any shortness of breath, palpitations, syncope, presyncope, dizziness, orthopnea, PND, swelling or significant weight changes, acute bleeding, or claudication.   EKGs/Labs/Other Studies Reviewed:   The following studies were reviewed today:   EKG:          Echocardiogram 05/2023: 1. Left ventricular ejection fraction, by estimation, is 60 to 65%. Left  ventricular ejection fraction by 3D volume is 62 %. The left ventricle has  normal function. The left ventricle has no regional wall motion  abnormalities. Left ventricular diastolic   parameters were normal.   2. Right ventricular systolic function is normal. The right ventricular  size is normal. There is normal pulmonary artery systolic pressure. The  estimated right ventricular systolic pressure is 35.5 mmHg.   3. The mitral valve is degenerative. Mild to moderate mitral valve  regurgitation. No evidence of mitral  stenosis. The mean mitral valve  gradient is 3.0 mmHg.   4. The aortic valve is tricuspid. There is mild calcification of the  aortic valve. Aortic valve regurgitation is not visualized. Aortic valve  sclerosis/calcification is present, without any evidence of aortic  stenosis. Aortic valve mean gradient  measures 6.0 mmHg.   5. The inferior vena cava is normal in size with  greater than 50%  respiratory variability, suggesting right atrial pressure of 3 mmHg.   Comparison(s): A prior study was performed on 08/01/2021. Prior images  reviewed side by side. LVEF normal range at 60-65%. Mild to moderate  mitral regurgitation.  Echo 07/2021:  1. Left ventricular ejection fraction, by estimation, is 60 to 65%. The  left ventricle has normal function. The left ventricle has no regional  wall motion abnormalities. Left ventricular diastolic parameters were  normal. The average left ventricular  global longitudinal strain is -19.3 %. The global longitudinal strain is  normal.   2. Right ventricular systolic function is normal. The right ventricular  size is mildly enlarged. There is normal pulmonary artery systolic  pressure. The estimated right ventricular systolic pressure is 33.5 mmHg.   3. The mitral valve is abnormal. Mild mitral valve regurgitation.  Moderate mitral annular calcification.   4. The aortic valve is tricuspid. Aortic valve regurgitation is not  visualized. Aortic valve sclerosis is present, with no evidence of aortic  valve stenosis.   5. The inferior vena cava is normal in size with greater than 50%  respiratory variability, suggesting right atrial pressure of 3 mmHg.   Comparison(s): No significant change from prior study. Prior images  reviewed side by side.   Review of Systems    All other systems reviewed and are otherwise negative except as noted above.  Physical Exam    VS:  LMP  (LMP Unknown)  , BMI There is no height or weight on file to calculate BMI.  Wt Readings from Last 3 Encounters:  11/05/23 282 lb 3.2 oz (128 kg)  10/10/23 282 lb (127.9 kg)  10/05/23 282 lb (127.9 kg)     GEN: Morbidly obese, 53 y.o. female in no acute distress. HEENT: normal. Neck: Supple, no JVD, carotid bruits, or masses. Cardiac: S1/S2, RRR, grade 1/6 murmur, no rubs, no gallops. No clubbing, cyanosis, edema.  Radials/PT 2+ and equal bilaterally.   Respiratory:  Respirations regular and unlabored, clear to auscultation bilaterally. GI: Soft, nontender, nondistended. MS: No deformity or atrophy. Skin: Warm and dry, no rash. Neuro:  Strength and sensation are intact. Psych: Normal affect.  Assessment & Plan    History of chest pain/chest pain of uncertain etiology Past NST at [facility] in 2018 that was very low risk.  Recent limited episode, anxiety and stress seem to be contributing, she does want to know her risk.  She does have some risk factors for CAD.  Will arrange CCTA - recent kidney function normal.  No other medication changes at this time.  Instructed her regarding her medication including Lopressor .  She verbalized understanding.  Care and ED precautions discussed.  Palpitations, hypokalemia Denies any recent palpitations.  Continue Lopressor .  Continue rest of medication regimen. Heart healthy diet and regular cardiovascular exercise encouraged.  She is currently taking a potassium supplement.  We will recheck a BMET for further evaluation.  3. Elevated BP Admits to elevated BP readings-anxiety and stress could be contributing to this.  Her blood pressure today in the office is excellent.  Instructed her to monitor BP  at home at least 2 hours after medications and sitting for 5-10 minutes.  No medication changes at this time.  4. HLD LDL 93 in 10/2022. Continue atorvastatin . Heart healthy diet and regular cardiovascular exercise encouraged.  This is being managed by her endocrinologist-continue follow-up with endocrinology.  5. OSA on CPAP Encouraged continued compliance.   6. Morbid obesity  Weight loss via diet and exercise encouraged. Discussed the impact being overweight would have on cardiovascular risk.  Previously referred to healthy weight and wellness clinic.  Disposition: Follow up in 6 weeks with Alvan Carrier, MD or APP.  Signed, Almarie Crate, NP

## 2023-12-11 ENCOUNTER — Ambulatory Visit (HOSPITAL_COMMUNITY): Admission: RE | Admit: 2023-12-11 | Source: Ambulatory Visit

## 2023-12-12 ENCOUNTER — Encounter: Payer: Self-pay | Admitting: "Endocrinology

## 2023-12-12 LAB — LAB REPORT - SCANNED
EGFR: 81
Free T4: 1.12
TSH: 1.16 (ref 0.41–5.90)

## 2023-12-17 ENCOUNTER — Ambulatory Visit: Admitting: "Endocrinology

## 2023-12-19 ENCOUNTER — Telehealth: Payer: Self-pay | Admitting: Pharmacist

## 2023-12-19 NOTE — Telephone Encounter (Signed)
 Pt c/o medication issue:  1. Name of Medication: ezetimibe  (ZETIA ) 10 MG tablet   2. How are you currently taking this medication (dosage and times per day)? As written   3. Are you having a reaction (difficulty breathing--STAT)? No   4. What is your medication issue? Pt called in stating her legs have been aching and she has stomach cramps since starting this medication. Please advise.

## 2023-12-19 NOTE — Telephone Encounter (Signed)
 I will reach out to PharmD

## 2023-12-20 ENCOUNTER — Other Ambulatory Visit (HOSPITAL_COMMUNITY): Payer: Self-pay

## 2023-12-20 ENCOUNTER — Ambulatory Visit: Admitting: "Endocrinology

## 2023-12-20 ENCOUNTER — Telehealth: Payer: Self-pay | Admitting: Pharmacist

## 2023-12-20 ENCOUNTER — Telehealth: Payer: Self-pay | Admitting: Pharmacy Technician

## 2023-12-20 NOTE — Telephone Encounter (Signed)
   Pharmacy Patient Advocate Encounter   Received notification from Pt Calls Messages that prior authorization for repatha is required/requested.   Insurance verification completed.   The patient is insured through [facility].   Per test claim: PA required; PA submitted to above mentioned insurance via Latent Key/confirmation #/EOC Rachel Holland Status is pending

## 2023-12-20 NOTE — Telephone Encounter (Signed)
 Spoke to patient, reports Zetia  causing stomach cramps ( tried with food too) when stopped cramps went away and when she restarted the symptoms came back. Will assess coverage for PCSK9i

## 2023-12-20 NOTE — Telephone Encounter (Signed)
 Pt calling back to speak with you

## 2023-12-21 ENCOUNTER — Other Ambulatory Visit (HOSPITAL_BASED_OUTPATIENT_CLINIC_OR_DEPARTMENT_OTHER): Payer: Self-pay

## 2023-12-21 ENCOUNTER — Other Ambulatory Visit (HOSPITAL_COMMUNITY): Payer: Self-pay

## 2023-12-21 MED ORDER — REPATHA SURECLICK 140 MG/ML ~~LOC~~ SOAJ
140.0000 mg | SUBCUTANEOUS | 3 refills | Status: AC
  Filled 2023-12-21: qty 2, 28d supply, fill #0

## 2023-12-21 NOTE — Telephone Encounter (Signed)
 I have the pharmacy the coupon   Pharmacy Patient Advocate Encounter  Received notification from [facility] that Prior Authorization for repatha  has been APPROVED from 12/21/23 to 12/19/26. Ran test claim, Copay is $559.81. This test claim was processed through [facility]- copay amounts may vary at other pharmacies due to pharmacy/plan contracts, or as the patient moves through the different stages of their insurance plan.   PA #/Case ID/Reference #: [id number]      15.00 ON COUPON

## 2023-12-21 NOTE — Telephone Encounter (Signed)
 Pt informed about PA approval - prescription sent to [facility] p'cy. Co-pay $45 for 3 months supply

## 2023-12-21 NOTE — Telephone Encounter (Signed)
 PA for Repatha approved.

## 2023-12-21 NOTE — Addendum Note (Signed)
 Addended by: Nari Vannatter K on: 12/21/2023 04:58 PM   Modules accepted: Orders

## 2023-12-25 ENCOUNTER — Encounter: Payer: Self-pay | Admitting: Internal Medicine

## 2023-12-25 ENCOUNTER — Other Ambulatory Visit (HOSPITAL_BASED_OUTPATIENT_CLINIC_OR_DEPARTMENT_OTHER): Payer: Self-pay

## 2023-12-26 ENCOUNTER — Ambulatory Visit: Admitting: "Endocrinology

## 2023-12-27 IMAGING — CT CT ABD-PEL WO/W CM
4 of 12 series · 12 of 46 positions shown, 18 images · IV contrast (Omnipaque or Isovue)
Comparison: Multiple priors including most recent CT January 16, 2021.

CLINICAL DATA: Gross hematuria.

EXAM:
CT ABDOMEN AND PELVIS WITHOUT AND WITH CONTRAST
TECHNIQUE: Multidetector CT imaging of the abdomen and pelvis was performed
following the standard protocol before and following the bolus
administration of intravenous contrast.

[Series 2: axial pre · axial · non-contrast · 0.84mm/px · z∈[-544,-419]mm · 2 of 99 slices shown]
[im 25/99  soft-tissue]
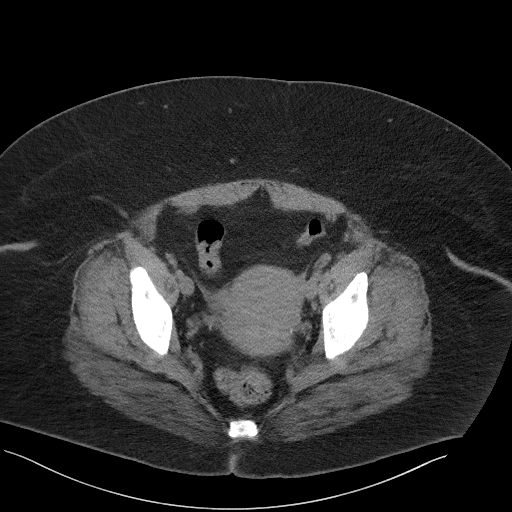
[im 50/99  soft-tissue]
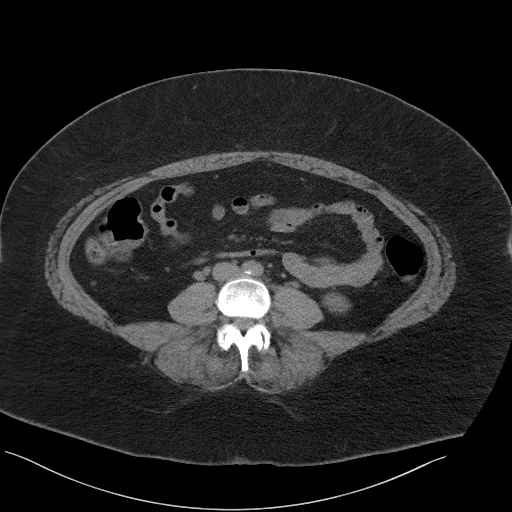

[Series 5: coronal pre · coronal · non-contrast · 0.93mm/px · 2 of 101 slices shown, 3 images]
[im 34/101  soft-tissue]
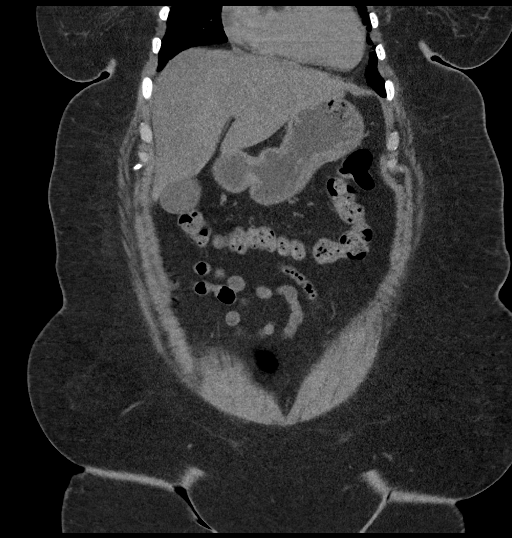
[im 34/101  bone]
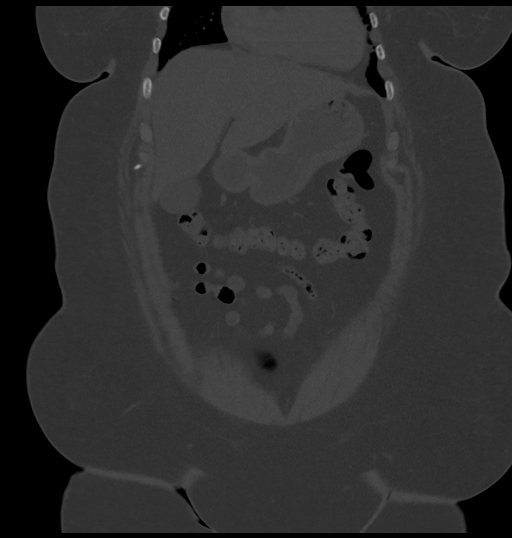
[im 67/101  soft-tissue]
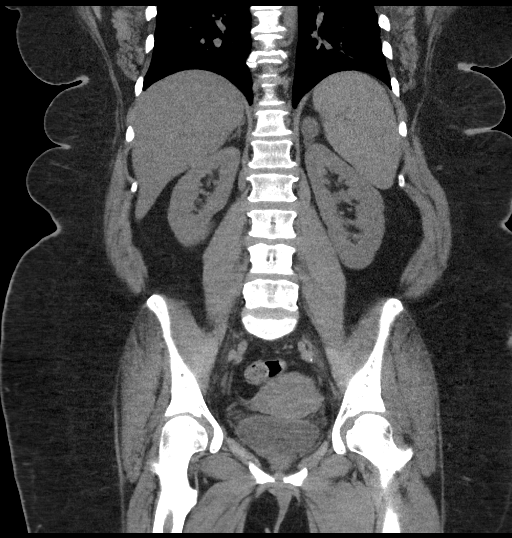

[Series 7: axial post · axial · 0.84mm/px · z∈[-569,-274]mm · 4 of 99 slices shown, 9 images]
[im 20/99  soft-tissue]
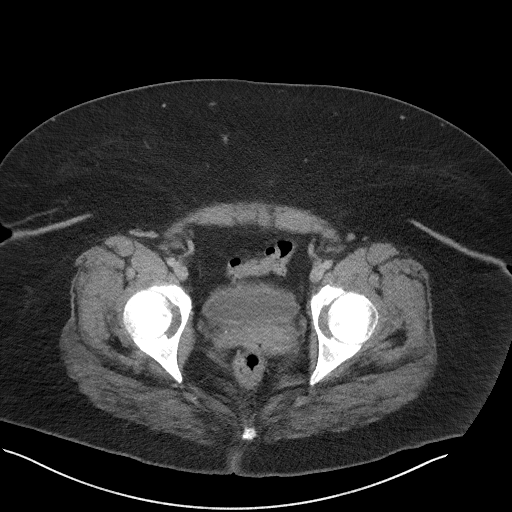
[im 20/99  lung]
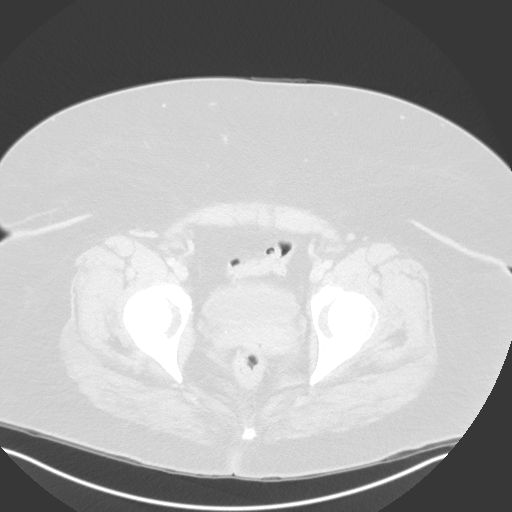
[im 20/99  bone]
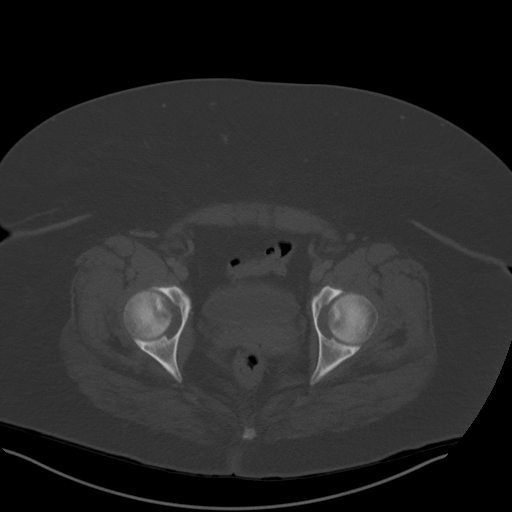
[im 40/99  soft-tissue]
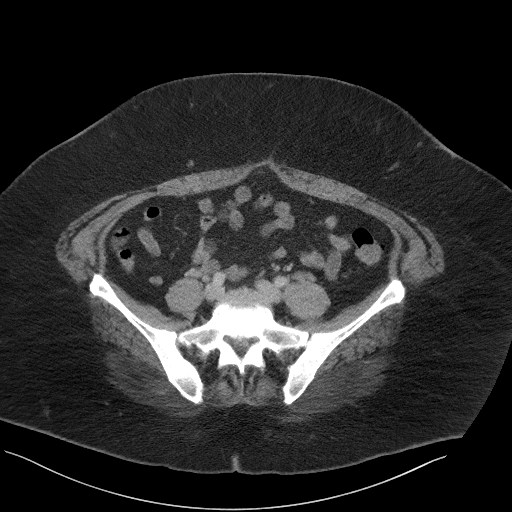
[im 40/99  lung]
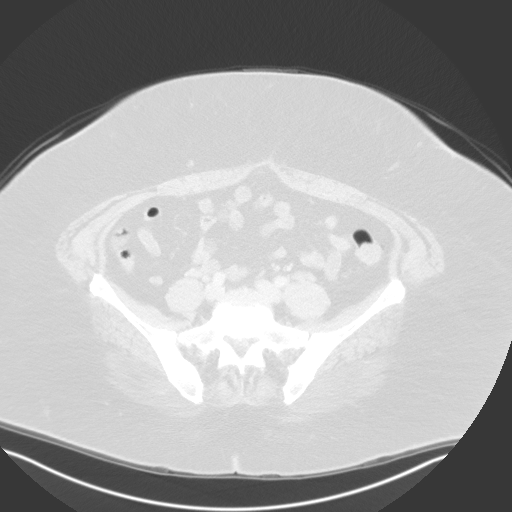
[im 59/99  soft-tissue]
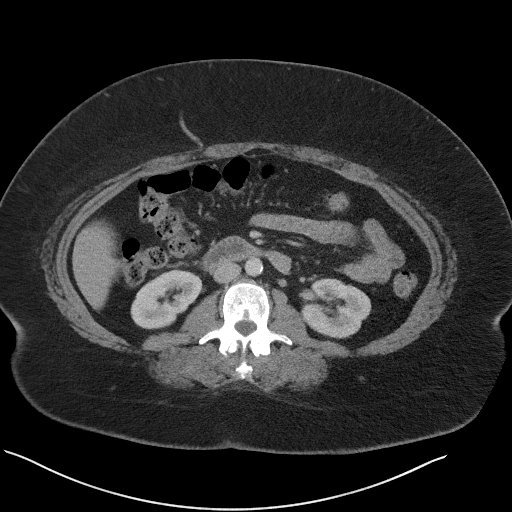
[im 59/99  lung]
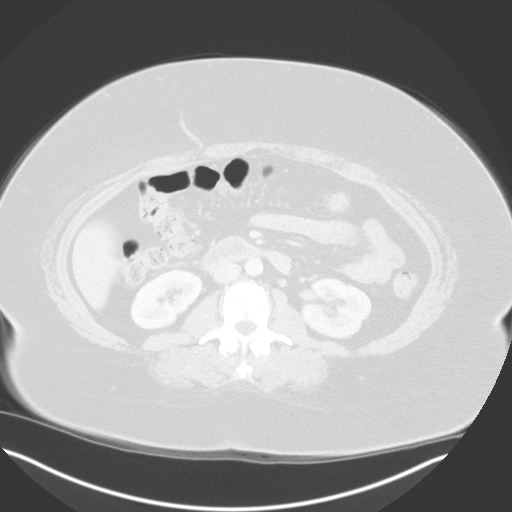
[im 79/99  soft-tissue]
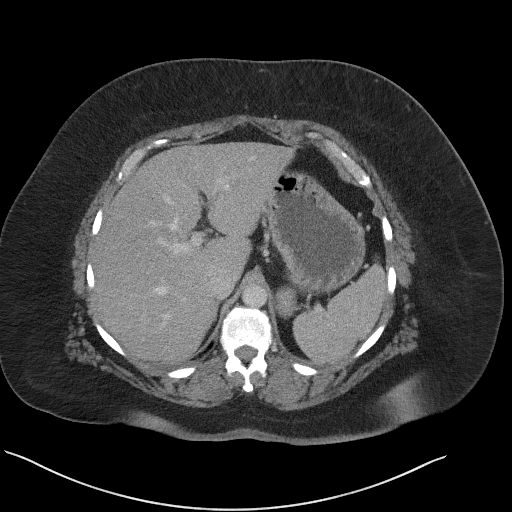
[im 79/99  lung]
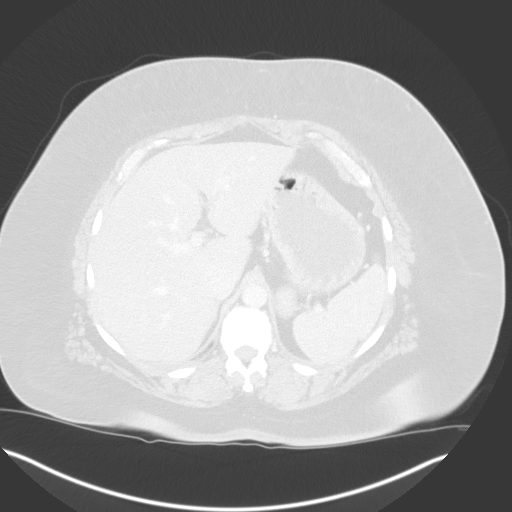

[Series 12: axial delay · axial · delayed · 0.86mm/px · z∈[-568,-278]mm · 4 of 98 slices shown]
[im 20/98  soft-tissue]
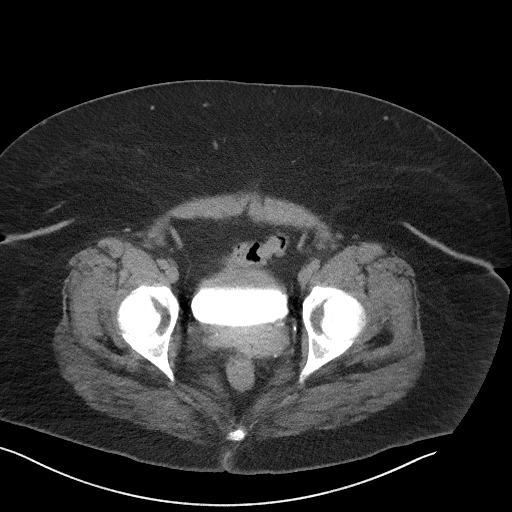
[im 39/98  soft-tissue]
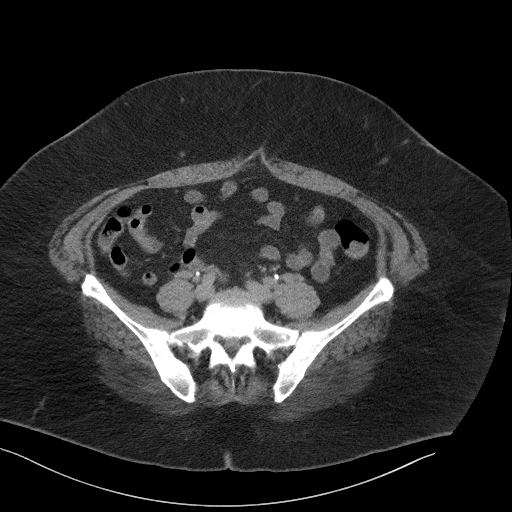
[im 59/98  soft-tissue]
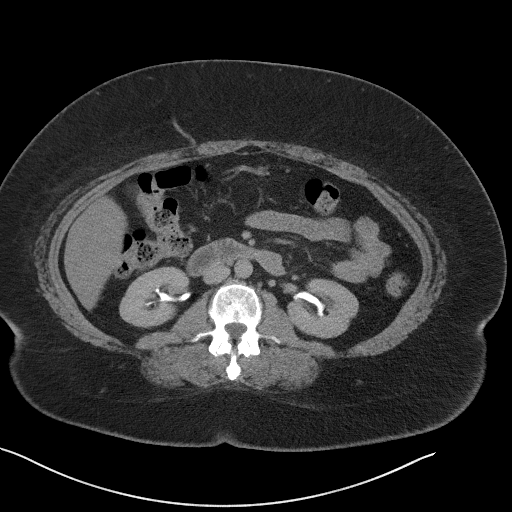
[im 78/98  soft-tissue]
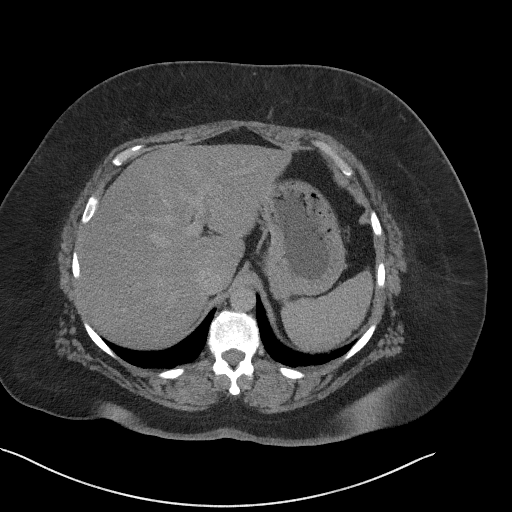

[12 of 46 positions shown; findings below may reference images not displayed]

RADIATION DOSE REDUCTION: This exam was performed according to the
departmental dose-optimization program which includes automated
exposure control, adjustment of the mA and/or kV according to
patient size and/or use of iterative reconstruction technique.

CONTRAST:  100mL OMNIPAQUE IOHEXOL 300 MG/ML  SOLN
FINDINGS: Lower chest: No acute abnormality.

Hepatobiliary: Hepatomegaly with the liver measuring 19 cm in
maximum craniocaudal dimension at the midclavicular line. Hepatic
steatosis. No suspicious hepatic lesion. Gallbladder is
unremarkable. No biliary ductal dilation.

Pancreas: No pancreatic ductal dilation or evidence of acute
inflammation.

Spleen: Normal in size without focal abnormality.

Adrenals/Urinary Tract: Bilateral adrenal adenomas are stable
measuring 3.3 cm on the left and 1 cm on the right.

No hydronephrosis. No renal, ureteral or bladder calculi identified.

No solid enhancing renal mass.

Kidneys demonstrate symmetric enhancement and excretion of contrast
material. No collecting system duplication. No suspicious filling
defect visualized within the opacified portions of the collecting
systems or ureters on delayed imaging.

Evaluation of the urinary bladder is limited by urine contamination
and under distension, within this context there is no focal wall
thickening or suspicious intraluminal filling defect visualized.

Stomach/Bowel: No enteric contrast was administered. Stomach is
mildly distended without wall thickening. No pathologic dilation of
small or large bowel. The terminal ileum and appendix appear normal.
Scattered sigmoid colonic diverticulosis without findings of acute
diverticulitis. No suspicious colonic wall thickening or mass like
lesions identified.

Vascular/Lymphatic: Scattered aortic atherosclerosis without
abdominal aortic aneurysm. No pathologically enlarged abdominal or
pelvic lymph nodes.

Reproductive: Uterus and bilateral adnexa are unremarkable.

Other: No significant abdominopelvic free fluid.

Musculoskeletal: Mild thoracolumbar spondylosis. No aggressive lytic
or blastic lesion of bone.
IMPRESSION: 1. No hydronephrosis. No renal, ureteral or bladder calculi
identified.
2. No solid enhancing renal mass.
3. Colonic diverticulosis without findings of acute diverticulitis.
4. Hepatomegaly with hepatic steatosis.
5. Stable bilateral adrenal adenomas.
6.  Aortic Atherosclerosis ([id number]).

## 2024-01-03 ENCOUNTER — Encounter: Payer: Self-pay | Admitting: "Endocrinology

## 2024-01-03 ENCOUNTER — Ambulatory Visit (INDEPENDENT_AMBULATORY_CARE_PROVIDER_SITE_OTHER): Admitting: "Endocrinology

## 2024-01-03 VITALS — BP 136/88 | HR 72 | Ht 62.0 in | Wt 273.4 lb

## 2024-01-03 DIAGNOSIS — E782 Mixed hyperlipidemia: Secondary | ICD-10-CM | POA: Diagnosis not present

## 2024-01-03 DIAGNOSIS — R7303 Prediabetes: Secondary | ICD-10-CM

## 2024-01-03 DIAGNOSIS — D3501 Benign neoplasm of right adrenal gland: Secondary | ICD-10-CM

## 2024-01-03 DIAGNOSIS — D3502 Benign neoplasm of left adrenal gland: Secondary | ICD-10-CM | POA: Insufficient documentation

## 2024-01-03 DIAGNOSIS — E236 Other disorders of pituitary gland: Secondary | ICD-10-CM | POA: Diagnosis not present

## 2024-01-03 LAB — POCT GLYCOSYLATED HEMOGLOBIN (HGB A1C): HbA1c, POC (controlled diabetic range): 5.8 % (ref 0.0–7.0)

## 2024-01-03 MED ORDER — WEGOVY 0.25 MG/0.5ML ~~LOC~~ SOAJ: 0.2500 mg | SUBCUTANEOUS | 0 refills | Status: AC

## 2024-01-03 NOTE — Patient Instructions (Signed)
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
## 2024-01-04 ENCOUNTER — Ambulatory Visit: Admitting: Nurse Practitioner

## 2024-01-07 ENCOUNTER — Other Ambulatory Visit: Payer: Self-pay | Admitting: *Deleted

## 2024-01-07 ENCOUNTER — Other Ambulatory Visit (HOSPITAL_BASED_OUTPATIENT_CLINIC_OR_DEPARTMENT_OTHER): Payer: Self-pay

## 2024-01-07 MED ORDER — HYDROCHLOROTHIAZIDE 12.5 MG PO TABS
12.5000 mg | ORAL_TABLET | Freq: Every day | ORAL | 1 refills | Status: AC
  Filled 2024-01-07: qty 90, 90d supply, fill #0

## 2024-01-08 ENCOUNTER — Ambulatory Visit (HOSPITAL_COMMUNITY): Admission: RE | Admit: 2024-01-08 | Source: Ambulatory Visit

## 2024-01-09 ENCOUNTER — Other Ambulatory Visit (HOSPITAL_BASED_OUTPATIENT_CLINIC_OR_DEPARTMENT_OTHER): Payer: Self-pay

## 2024-01-09 ENCOUNTER — Other Ambulatory Visit: Payer: Self-pay

## 2024-01-09 MED ORDER — CEFUROXIME AXETIL 500 MG PO TABS
500.0000 mg | ORAL_TABLET | Freq: Two times a day (BID) | ORAL | 0 refills | Status: AC
  Filled 2024-01-09: qty 14, 7d supply, fill #0

## 2024-01-21 ENCOUNTER — Other Ambulatory Visit (HOSPITAL_COMMUNITY): Payer: Self-pay

## 2024-01-21 ENCOUNTER — Encounter (HOSPITAL_COMMUNITY)

## 2024-01-25 ENCOUNTER — Ambulatory Visit: Admitting: Cardiology

## 2024-01-29 ENCOUNTER — Ambulatory Visit (HOSPITAL_COMMUNITY)

## 2024-02-04 ENCOUNTER — Inpatient Hospital Stay (HOSPITAL_COMMUNITY): Admission: RE | Admit: 2024-02-04 | Source: Ambulatory Visit

## 2024-02-11 ENCOUNTER — Encounter: Payer: Self-pay | Admitting: Neurology

## 2024-02-14 ENCOUNTER — Other Ambulatory Visit: Payer: Self-pay | Admitting: Cardiology

## 2024-02-15 MED ORDER — METOPROLOL TARTRATE 25 MG PO TABS: 12.5000 mg | ORAL_TABLET | Freq: Four times a day (QID) | ORAL | 2 refills | Status: DC

## 2024-02-18 ENCOUNTER — Encounter: Payer: Self-pay | Admitting: Cardiology

## 2024-02-18 ENCOUNTER — Ambulatory Visit: Admitting: Cardiology

## 2024-02-18 VITALS — BP 128/64 | HR 74 | Ht 62.0 in | Wt 271.0 lb

## 2024-02-18 DIAGNOSIS — R002 Palpitations: Secondary | ICD-10-CM | POA: Diagnosis not present

## 2024-02-18 DIAGNOSIS — G4733 Obstructive sleep apnea (adult) (pediatric): Secondary | ICD-10-CM

## 2024-02-18 DIAGNOSIS — E782 Mixed hyperlipidemia: Secondary | ICD-10-CM

## 2024-02-18 DIAGNOSIS — R079 Chest pain, unspecified: Secondary | ICD-10-CM

## 2024-02-18 MED ORDER — METOPROLOL TARTRATE 25 MG PO TABS: 12.5000 mg | ORAL_TABLET | Freq: Four times a day (QID) | ORAL | 2 refills | Status: AC
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
## 2024-02-18 NOTE — Addendum Note (Signed)
 Addended by: KENETH KNEE C on: 02/18/2024 03:06 PM   Modules accepted: Orders

## 2024-02-18 NOTE — Patient Instructions (Signed)
 Medication Instructions:  Your physician recommends that you continue on your current medications as directed. Please refer to the Current Medication list given to you today.  *If you need a refill on your cardiac medications before your next appointment, please call your pharmacy*  Lab Work: None If you have labs (blood work) drawn today and your tests are completely normal, you will receive your results only by: MyChart Message (if you have MyChart) OR A paper copy in the mail If you have any lab test that is abnormal or we need to change your treatment, we will call you to review the results.  Testing/Procedures: None  Follow-Up: At [facility], you and your health needs are our priority.  As part of our continuing mission to provide you with exceptional heart care, our providers are all part of one team.  This team includes your primary Cardiologist (physician) and Advanced Practice Providers or APPs (Physician Assistants and Nurse Practitioners) who all work together to provide you with the care you need, when you need it.  Your next appointment:   4 month(s)  Provider:   You may see Dina Rich, MD or one of the following Advanced Practice Providers on your designated Care Team:   Randall An, PA-C  Sc[place]ia [place], [place] Jacolyn Reedy, [place]     We recommend signing up for the patient portal called "MyChart".  Sign up information is provided on this After Visit Summary.  MyChart is used to connect with patients for Virtual Visits (Telemedicine).  Patients are able to view lab/test results, encounter notes, upcoming appointments, etc.  Non-urgent messages can be sent to your provider as well.   To learn more about what you can do with MyChart, go to [email or web address].   Other Instructions

## 2024-03-03 ENCOUNTER — Ambulatory Visit (HOSPITAL_BASED_OUTPATIENT_CLINIC_OR_DEPARTMENT_OTHER)

## 2024-03-24 ENCOUNTER — Ambulatory Visit (HOSPITAL_BASED_OUTPATIENT_CLINIC_OR_DEPARTMENT_OTHER)

## 2024-04-02 ENCOUNTER — Ambulatory Visit: Admitting: "Endocrinology

## 2024-04-03 ENCOUNTER — Ambulatory Visit: Admitting: Neurology

## 2024-05-16 ENCOUNTER — Ambulatory Visit: Payer: Self-pay | Admitting: Neurology

## 2024-06-12 ENCOUNTER — Ambulatory Visit: Admitting: Cardiology
